# Patient Record
Sex: Female | Born: 1935 | Race: White | Hispanic: No | Smoking: Former smoker
Health system: Southern US, Community
[De-identification: ages and names within clinical notes are randomized; demographics above are authoritative.]

## PROBLEM LIST (undated history)

## (undated) DIAGNOSIS — K219 Gastro-esophageal reflux disease without esophagitis: Secondary | ICD-10-CM

## (undated) DIAGNOSIS — G4700 Insomnia, unspecified: Secondary | ICD-10-CM

## (undated) DIAGNOSIS — M542 Cervicalgia: Secondary | ICD-10-CM

## (undated) DIAGNOSIS — B370 Candidal stomatitis: Secondary | ICD-10-CM

## (undated) DIAGNOSIS — F32A Depression, unspecified: Secondary | ICD-10-CM

## (undated) DIAGNOSIS — J329 Chronic sinusitis, unspecified: Secondary | ICD-10-CM

## (undated) DIAGNOSIS — I10 Essential (primary) hypertension: Secondary | ICD-10-CM

## (undated) DIAGNOSIS — R1310 Dysphagia, unspecified: Secondary | ICD-10-CM

## (undated) DIAGNOSIS — R350 Frequency of micturition: Secondary | ICD-10-CM

## (undated) DIAGNOSIS — K859 Acute pancreatitis without necrosis or infection, unspecified: Secondary | ICD-10-CM

## (undated) DIAGNOSIS — K922 Gastrointestinal hemorrhage, unspecified: Secondary | ICD-10-CM

## (undated) DIAGNOSIS — R5383 Other fatigue: Secondary | ICD-10-CM

## (undated) DIAGNOSIS — K5900 Constipation, unspecified: Secondary | ICD-10-CM

## (undated) DIAGNOSIS — R251 Tremor, unspecified: Secondary | ICD-10-CM

## (undated) DIAGNOSIS — G629 Polyneuropathy, unspecified: Secondary | ICD-10-CM

## (undated) DIAGNOSIS — M25519 Pain in unspecified shoulder: Secondary | ICD-10-CM

## (undated) DIAGNOSIS — B372 Candidiasis of skin and nail: Secondary | ICD-10-CM

## (undated) DIAGNOSIS — E871 Hypo-osmolality and hyponatremia: Secondary | ICD-10-CM

## (undated) DIAGNOSIS — E559 Vitamin D deficiency, unspecified: Secondary | ICD-10-CM

## (undated) DIAGNOSIS — K274 Chronic or unspecified peptic ulcer, site unspecified, with hemorrhage: Secondary | ICD-10-CM

## (undated) DIAGNOSIS — R109 Unspecified abdominal pain: Secondary | ICD-10-CM

## (undated) DIAGNOSIS — R269 Unspecified abnormalities of gait and mobility: Secondary | ICD-10-CM

## (undated) DIAGNOSIS — R609 Edema, unspecified: Secondary | ICD-10-CM

## (undated) DIAGNOSIS — G2581 Restless legs syndrome: Secondary | ICD-10-CM

## (undated) DIAGNOSIS — K2970 Gastritis, unspecified, without bleeding: Secondary | ICD-10-CM

## (undated) DIAGNOSIS — R42 Dizziness and giddiness: Secondary | ICD-10-CM

## (undated) DIAGNOSIS — H547 Unspecified visual loss: Secondary | ICD-10-CM

## (undated) DIAGNOSIS — K5790 Diverticulosis of intestine, part unspecified, without perforation or abscess without bleeding: Secondary | ICD-10-CM

## (undated) DIAGNOSIS — B351 Tinea unguium: Secondary | ICD-10-CM

## (undated) DIAGNOSIS — J40 Bronchitis, not specified as acute or chronic: Secondary | ICD-10-CM

## (undated) DIAGNOSIS — K625 Hemorrhage of anus and rectum: Secondary | ICD-10-CM

## (undated) DIAGNOSIS — M1990 Unspecified osteoarthritis, unspecified site: Secondary | ICD-10-CM

## (undated) DIAGNOSIS — H04123 Dry eye syndrome of bilateral lacrimal glands: Secondary | ICD-10-CM

## (undated) DIAGNOSIS — J309 Allergic rhinitis, unspecified: Secondary | ICD-10-CM

## (undated) DIAGNOSIS — I739 Peripheral vascular disease, unspecified: Secondary | ICD-10-CM

## (undated) DIAGNOSIS — H269 Unspecified cataract: Secondary | ICD-10-CM

## (undated) DIAGNOSIS — R739 Hyperglycemia, unspecified: Secondary | ICD-10-CM

## (undated) DIAGNOSIS — E876 Hypokalemia: Secondary | ICD-10-CM

## (undated) DIAGNOSIS — K449 Diaphragmatic hernia without obstruction or gangrene: Secondary | ICD-10-CM

## (undated) DIAGNOSIS — N289 Disorder of kidney and ureter, unspecified: Secondary | ICD-10-CM

## (undated) DIAGNOSIS — R4182 Altered mental status, unspecified: Secondary | ICD-10-CM

## (undated) DIAGNOSIS — F329 Major depressive disorder, single episode, unspecified: Secondary | ICD-10-CM

## (undated) DIAGNOSIS — M21379 Foot drop, unspecified foot: Secondary | ICD-10-CM

## (undated) HISTORY — PX: CHOLECYSTECTOMY: SHX55

## (undated) HISTORY — PX: ABDOMINAL HYSTERECTOMY: SHX81

## (undated) HISTORY — PX: APPENDECTOMY: SHX54

---

## 2015-04-06 ENCOUNTER — Emergency Department (HOSPITAL_COMMUNITY): Payer: Medicare Other

## 2015-04-06 ENCOUNTER — Encounter (HOSPITAL_COMMUNITY): Payer: Self-pay | Admitting: Emergency Medicine

## 2015-04-06 ENCOUNTER — Emergency Department (HOSPITAL_COMMUNITY)
Admission: EM | Admit: 2015-04-06 | Discharge: 2015-04-06 | Disposition: A | Discharge status: Home / Self Care | Payer: Medicare Other | Attending: Emergency Medicine | Admitting: Emergency Medicine

## 2015-04-06 DIAGNOSIS — Z3202 Encounter for pregnancy test, result negative: Secondary | ICD-10-CM | POA: Insufficient documentation

## 2015-04-06 DIAGNOSIS — M1990 Unspecified osteoarthritis, unspecified site: Secondary | ICD-10-CM | POA: Insufficient documentation

## 2015-04-06 DIAGNOSIS — K219 Gastro-esophageal reflux disease without esophagitis: Secondary | ICD-10-CM | POA: Insufficient documentation

## 2015-04-06 DIAGNOSIS — Z8711 Personal history of peptic ulcer disease: Secondary | ICD-10-CM | POA: Diagnosis not present

## 2015-04-06 DIAGNOSIS — N183 Chronic kidney disease, stage 3 (moderate): Secondary | ICD-10-CM | POA: Insufficient documentation

## 2015-04-06 DIAGNOSIS — R531 Weakness: Secondary | ICD-10-CM | POA: Diagnosis not present

## 2015-04-06 DIAGNOSIS — F329 Major depressive disorder, single episode, unspecified: Secondary | ICD-10-CM | POA: Insufficient documentation

## 2015-04-06 DIAGNOSIS — Z872 Personal history of diseases of the skin and subcutaneous tissue: Secondary | ICD-10-CM | POA: Diagnosis not present

## 2015-04-06 DIAGNOSIS — I129 Hypertensive chronic kidney disease with stage 1 through stage 4 chronic kidney disease, or unspecified chronic kidney disease: Secondary | ICD-10-CM | POA: Diagnosis not present

## 2015-04-06 DIAGNOSIS — Z79899 Other long term (current) drug therapy: Secondary | ICD-10-CM | POA: Diagnosis not present

## 2015-04-06 DIAGNOSIS — R410 Disorientation, unspecified: Secondary | ICD-10-CM | POA: Insufficient documentation

## 2015-04-06 HISTORY — DX: Hypercalcemia: E83.52

## 2015-04-06 HISTORY — DX: Gastro-esophageal reflux disease without esophagitis: K21.9

## 2015-04-06 HISTORY — DX: Foot drop, unspecified foot: M21.379

## 2015-04-06 HISTORY — DX: Dry eye syndrome of bilateral lacrimal glands: H04.123

## 2015-04-06 HISTORY — DX: Candidal stomatitis: B37.0

## 2015-04-06 HISTORY — DX: Acute pancreatitis without necrosis or infection, unspecified: K85.90

## 2015-04-06 HISTORY — DX: Diverticulosis of intestine, part unspecified, without perforation or abscess without bleeding: K57.90

## 2015-04-06 HISTORY — DX: Unspecified abdominal pain: R10.9

## 2015-04-06 HISTORY — DX: Other fatigue: R53.83

## 2015-04-06 HISTORY — DX: Essential (primary) hypertension: I10

## 2015-04-06 HISTORY — DX: Major depressive disorder, single episode, unspecified: F32.9

## 2015-04-06 HISTORY — DX: Candidiasis of skin and nail: B37.2

## 2015-04-06 HISTORY — DX: Tinea unguium: B35.1

## 2015-04-06 HISTORY — DX: Unspecified cataract: H26.9

## 2015-04-06 HISTORY — DX: Insomnia, unspecified: G47.00

## 2015-04-06 HISTORY — DX: Constipation, unspecified: K59.00

## 2015-04-06 HISTORY — DX: Depression, unspecified: F32.A

## 2015-04-06 HISTORY — DX: Unspecified osteoarthritis, unspecified site: M19.90

## 2015-04-06 HISTORY — DX: Hypo-osmolality and hyponatremia: E87.1

## 2015-04-06 HISTORY — DX: Restless legs syndrome: G25.81

## 2015-04-06 HISTORY — DX: Tremor, unspecified: R25.1

## 2015-04-06 HISTORY — DX: Hypokalemia: E87.6

## 2015-04-06 HISTORY — DX: Cervicalgia: M54.2

## 2015-04-06 HISTORY — DX: Chronic sinusitis, unspecified: J32.9

## 2015-04-06 HISTORY — DX: Disorder of kidney and ureter, unspecified: N28.9

## 2015-04-06 HISTORY — DX: Polyneuropathy, unspecified: G62.9

## 2015-04-06 HISTORY — DX: Allergic rhinitis, unspecified: J30.9

## 2015-04-06 HISTORY — DX: Hyperglycemia, unspecified: R73.9

## 2015-04-06 HISTORY — DX: Hemorrhage of anus and rectum: K62.5

## 2015-04-06 HISTORY — DX: Dysphagia, unspecified: R13.10

## 2015-04-06 HISTORY — DX: Bronchitis, not specified as acute or chronic: J40

## 2015-04-06 HISTORY — DX: Pain in unspecified shoulder: M25.519

## 2015-04-06 HISTORY — DX: Unspecified abnormalities of gait and mobility: R26.9

## 2015-04-06 HISTORY — DX: Peripheral vascular disease, unspecified: I73.9

## 2015-04-06 HISTORY — DX: Dizziness and giddiness: R42

## 2015-04-06 HISTORY — DX: Gastrointestinal hemorrhage, unspecified: K92.2

## 2015-04-06 HISTORY — DX: Vitamin D deficiency, unspecified: E55.9

## 2015-04-06 HISTORY — DX: Frequency of micturition: R35.0

## 2015-04-06 HISTORY — DX: Gastritis, unspecified, without bleeding: K29.70

## 2015-04-06 HISTORY — DX: Diaphragmatic hernia without obstruction or gangrene: K44.9

## 2015-04-06 HISTORY — DX: Unspecified visual loss: H54.7

## 2015-04-06 HISTORY — DX: Altered mental status, unspecified: R41.82

## 2015-04-06 HISTORY — DX: Chronic or unspecified peptic ulcer, site unspecified, with hemorrhage: K27.4

## 2015-04-06 HISTORY — DX: Edema, unspecified: R60.9

## 2015-04-06 LAB — COMPREHENSIVE METABOLIC PANEL
ALT: 15 U/L (ref 14–54)
AST: 21 U/L (ref 15–41)
Albumin: 4.1 g/dL (ref 3.5–5.0)
Alkaline Phosphatase: 64 U/L (ref 38–126)
Anion gap: 8 (ref 5–15)
BILIRUBIN TOTAL: 1.3 mg/dL — AB (ref 0.3–1.2)
BUN: 17 mg/dL (ref 6–20)
CALCIUM: 9.9 mg/dL (ref 8.9–10.3)
CHLORIDE: 100 mmol/L — AB (ref 101–111)
CO2: 31 mmol/L (ref 22–32)
Creatinine, Ser: 0.94 mg/dL (ref 0.44–1.00)
GFR calc Af Amer: 57 mL/min — ABNORMAL LOW (ref >60)
GFR, EST NON AFRICAN AMERICAN: 50 mL/min — AB (ref >60)
GLUCOSE: 122 mg/dL — AB (ref 65–99)
Potassium: 4.3 mmol/L (ref 3.5–5.1)
SODIUM: 139 mmol/L (ref 135–145)
TOTAL PROTEIN: 7.3 g/dL (ref 6.5–8.1)

## 2015-04-06 LAB — CBC WITH DIFFERENTIAL/PLATELET
Basophils Absolute: 0 10*3/uL (ref 0.0–0.1)
Basophils Relative: 0 % (ref 0–1)
Eosinophils Absolute: 0.1 10*3/uL (ref 0.0–0.7)
Eosinophils Relative: 2 % (ref 0–5)
HCT: 44.6 % (ref 36.0–46.0)
Hemoglobin: 14.7 g/dL (ref 12.0–15.0)
LYMPHS ABS: 1.1 10*3/uL (ref 0.7–4.0)
Lymphocytes Relative: 15 % (ref 12–46)
MCH: 29.9 pg (ref 26.0–34.0)
MCHC: 33 g/dL (ref 30.0–36.0)
MCV: 90.8 fL (ref 78.0–100.0)
MONO ABS: 0.5 10*3/uL (ref 0.1–1.0)
Monocytes Relative: 7 % (ref 3–12)
Neutro Abs: 5.8 10*3/uL (ref 1.7–7.7)
Neutrophils Relative %: 76 % (ref 43–77)
Platelets: 139 10*3/uL — ABNORMAL LOW (ref 150–400)
RBC: 4.91 MIL/uL (ref 3.87–5.11)
RDW: 13.3 % (ref 11.5–15.5)
WBC: 7.6 10*3/uL (ref 4.0–10.5)

## 2015-04-06 LAB — I-STAT BETA HCG BLOOD, ED (MC, WL, AP ONLY): I-stat hCG, quantitative: 7.9 m[IU]/mL — ABNORMAL HIGH (ref <5)

## 2015-04-06 LAB — I-STAT CG4 LACTIC ACID, ED
LACTIC ACID, VENOUS: 1.19 mmol/L (ref 0.5–2.0)
LACTIC ACID, VENOUS: 0.86 mmol/L (ref 0.5–2.0)
LACTIC ACID, VENOUS: 0.96 mmol/L (ref 0.5–2.0)

## 2015-04-06 LAB — URINALYSIS, ROUTINE W REFLEX MICROSCOPIC
BILIRUBIN URINE: NEGATIVE
GLUCOSE, UA: NEGATIVE mg/dL
Hgb urine dipstick: NEGATIVE
Ketones, ur: NEGATIVE mg/dL
LEUKOCYTES UA: NEGATIVE
Nitrite: NEGATIVE
PROTEIN: 30 mg/dL — AB
SPECIFIC GRAVITY, URINE: 1.005 (ref 1.005–1.030)
UROBILINOGEN UA: 1 mg/dL (ref 0.0–1.0)
pH: 7 (ref 5.0–8.0)

## 2015-04-06 LAB — I-STAT TROPONIN, ED: Troponin i, poc: 0.02 ng/mL (ref 0.00–0.08)

## 2015-04-06 LAB — URINE MICROSCOPIC-ADD ON

## 2015-04-06 MED ORDER — HYDROCHLOROTHIAZIDE 12.5 MG PO CAPS
25.0000 mg | ORAL_CAPSULE | Freq: Once | ORAL | Status: AC
  Administered 2015-04-06: 25 mg via ORAL
  Filled 2015-04-06: qty 2

## 2015-04-06 NOTE — Progress Notes (Addendum)
Pt listed from [facility] facility with dementia Cm went to pt room to review forms from facility which indicates Alyssa [name] as pcp EPIC updated  Cm spoke with pt who is oriented to person, place and situation Pt able to tell Cm who she is where she lived prior to facility and that she knew she was "in the hospital" but could not tell CM name of hospital. Cm informed her she was at [facility] long Pt inquired of Cm how she could use the restroom.  Cm assisted her to the bedpan Provided nursing with pt urine sample Pt able to lift her bottom and to turn to both sides with minimal assistance or redirection.  Pt very pleasant and cooperative Cm instructed pt on use of call bell for nursing staff Prior to Cm leaving Xray staff came to take pt to radiology

## 2015-04-06 NOTE — ED Provider Notes (Signed)
CSN: [id number]     Arrival date & time 04/06/15  1129 History   First MD Initiated Contact with Patient 04/06/15 1156     Chief Complaint  Patient presents with  . Weakness     (Consider location/radiation/quality/duration/timing/severity/associated sxs/prior Treatment) Patient is a 81 y.o. female presenting with weakness. The history is provided by the nursing home, medical records and the EMS personnel. No language interpreter was used.  Weakness Associated symptoms include weakness.     Leylany Nored is a 81 y.o. female  with a hx of hypertension, CK D, urinary frequency, neuropathy, GERD, dementia presents to the Emergency Department [facility].  Patient's facility reports that she is shakier than usual and has increased generalized weakness. Per EMS the facility states patient is at mental baseline and she has dementia. Level V caveat.  Discussed with LPN at patient's facility. He reports she was somewhat confused yesterday but that increased significantly this morning. They reported that she had decreased responses to their questioning and that she was very shaky, unable to walk with her walker which is unusual for her.  Past Medical History  Diagnosis Date  . Hypertension   . Renal disorder     CKD stage 3  . Dizziness   . Foot drop   . Bronchitis   . Urinary frequency   . Candidal intertrigo   . Altered mental status   . Fatigue   . Chronic GI hemorrhage   . Edema   . Gait disturbance   . Neuropathy   . Vision impairment   . Dry eyes   . Thrush   . GERD (gastroesophageal reflux disease)   . Pancreatitis   . Abdominal pain   . Rectal bleed   . Shoulder pain   . Sinusitis   . Allergic rhinitis   . Hypokalemia   . Hyponatremia   . Hyperglycemia   . Tremor   . Hypercalcemia   . Neck pain   . Vitamin D deficiency   . Depression   . Osteoarthritis   . Abnormal gait   . Intermittent claudication   . Onychomycosis   . Constipation   .  Dysphagia   . Insomnia   . Cataract   . Restless leg syndrome   . Peptic ulcer disease with hemorrhage   . Diverticulosis   . Gastritis   . Hiatal hernia    Past Surgical History  Procedure Laterality Date  . Cholecystectomy    . Appendectomy    . Abdominal hysterectomy     No family history on file. History  Substance Use Topics  . Smoking status: Not on file  . Smokeless tobacco: Not on file  . Alcohol Use: No   OB History    No data available     Review of Systems  Unable to perform ROS: Dementia  Neurological: Positive for weakness.      Allergies  Iron and Voltaren  Home Medications   Prior to Admission medications   Medication Sig Start Date End Date Taking? Authorizing Provider  acetaminophen (TYLENOL) 500 MG tablet Take 1,000 mg by mouth at bedtime. May also take twice daily as needed for pain   Yes Historical Provider, MD  ergocalciferol (VITAMIN D2) 50000 UNITS capsule Take 50,000 Units by mouth every 30 (thirty) days.    Yes Historical Provider, MD  guaiFENesin (MUCINEX) 600 MG 12 hr tablet Take 600 mg by mouth 2 (two) times daily.   Yes Historical Provider, MD  guaiFENesin (ROBITUSSIN) 100 MG/5ML liquid Take 200 mg by mouth 2 (two) times daily as needed for cough.   Yes Historical Provider, MD  hydrochlorothiazide (HYDRODIURIL) 25 MG tablet Take 25 mg by mouth daily.   Yes Historical Provider, MD  ipratropium (ATROVENT) 0.03 % nasal spray Place 2 sprays into both nostrils 2 (two) times daily.   Yes Historical Provider, MD  IPRATROPIUM BROMIDE HFA IN Inhale 2 puffs into the lungs 2 (two) times daily.   Yes Historical Provider, MD  loratadine (CLARITIN) 10 MG tablet Take 10 mg by mouth daily.   Yes Historical Provider, MD  losartan (COZAAR) 100 MG tablet Take 100 mg by mouth daily.   Yes Historical Provider, MD  meclizine (ANTIVERT) 25 MG tablet Take 25 mg by mouth 3 (three) times daily as needed for dizziness.   Yes Historical Provider, MD  metoprolol  succinate (TOPROL-XL) 50 MG 24 hr tablet Take 50 mg by mouth daily.   Yes Historical Provider, MD  mirtazapine (REMERON) 15 MG tablet Take 15 mg by mouth at bedtime.   Yes Historical Provider, MD  omeprazole (PRILOSEC) 20 MG capsule Take 20 mg by mouth 2 (two) times daily.   Yes Historical Provider, MD  polyethylene glycol (MIRALAX / GLYCOLAX) packet Take 17 g by mouth daily. Mix with 4-8oz of liquid   Yes Historical Provider, MD  sennosides-docusate sodium (SENOKOT-S) 8.6-50 MG tablet Take 2 tablets by mouth 2 (two) times daily as needed for constipation.   Yes Historical Provider, MD  traZODone (DESYREL) 50 MG tablet Take 25 mg by mouth at bedtime.   Yes Historical Provider, MD   BP 216/91 mmHg  Pulse 66  Temp(Src) 98.5 F (36.9 C) (Oral)  Resp 16  SpO2 99% Physical Exam  Constitutional: She appears well-developed and well-nourished. No distress.  Awake, alert, nontoxic appearance  HENT:  Head: Normocephalic and atraumatic.  Mouth/Throat: Oropharynx is clear and moist. No oropharyngeal exudate.  Eyes: Conjunctivae are normal. No scleral icterus.  Neck: Normal range of motion. Neck supple.  Cardiovascular: Normal rate, regular rhythm, normal heart sounds and intact distal pulses.   No murmur heard. Pulmonary/Chest: Effort normal and breath sounds normal. No respiratory distress. She has no wheezes.  Equal chest expansion  Abdominal: Soft. Bowel sounds are normal. She exhibits no mass. There is no tenderness. There is no rebound and no guarding.  Soft and nontender  Musculoskeletal: Normal range of motion. She exhibits no edema.  Neurological: She is alert.  Speech is clear and goal oriented Moves extremities without ataxia 5/5 in BUE and BLE while lying in bed No tremor noted on seated exam DTRs 2+ in all 4 extremities  Skin: Skin is warm and dry. She is not diaphoretic. No erythema.  Psychiatric: She has a normal mood and affect.  Nursing note and vitals reviewed.   ED Course   Procedures (including critical care time) Labs Review Labs Reviewed  CBC WITH DIFFERENTIAL/PLATELET - Abnormal; Notable for the following:    Platelets 139 (*)    All other components within normal limits  COMPREHENSIVE METABOLIC PANEL - Abnormal; Notable for the following:    Chloride 100 (*)    Glucose, Bld 122 (*)    Total Bilirubin 1.3 (*)    GFR calc non Af Amer 50 (*)    GFR calc Af Amer 57 (*)    All other components within normal limits  URINALYSIS, ROUTINE W REFLEX MICROSCOPIC (NOT AT [facility]) - Abnormal; Notable for the following:  APPearance CLOUDY (*)    Protein, ur 30 (*)    All other components within normal limits  URINE MICROSCOPIC-ADD ON - Abnormal; Notable for the following:    Bacteria, UA MANY (*)    All other components within normal limits  I-STAT BETA HCG BLOOD, ED ([facility], [facility], [facility] ONLY) - Abnormal; Notable for the following:    I-stat hCG, quantitative 7.9 (*)    All other components within normal limits  I-STAT CG4 LACTIC ACID, ED  I-STAT TROPOININ, ED  I-STAT CG4 LACTIC ACID, ED  I-STAT CG4 LACTIC ACID, ED    Imaging Review Dg Chest 2 View  04/06/2015   CLINICAL DATA:  81 year old with cough. Weakness. History of bronchitis and chronic kidney disease. Initial encounter.  EXAM: CHEST  2 VIEW  COMPARISON:  None.  FINDINGS: The heart size and mediastinal contours are normal. There are calcified mediastinal and left hilar lymph nodes. Small calcified granulomas are present within the left lung. There is no confluent airspace opacity, edema or significant pleural effusion. Nodular density at the left apex is attributed to asymmetry of the left first costochondral junction. There is a probable loose body in the left superior subscapularis recess.  IMPRESSION: 1. No evidence of pneumonia or other acute chest process. 2. Evidence of prior granulomatous disease. 3. Probable asymmetry of the first costochondral junctions accounting for asymmetric density at the left lung  apex. This could be confirmed with an apical lordotic view.   Electronically Signed   By: Carey Bullocks M.D.   On: 04/06/2015 13:23   Ct Head Wo Contrast  04/06/2015   CLINICAL DATA:  Weakness and confusion  EXAM: CT HEAD WITHOUT CONTRAST  TECHNIQUE: Contiguous axial images were obtained from the base of the skull through the vertex without intravenous contrast.  COMPARISON:  None.  FINDINGS: Generalized atrophy. Moderate chronic microvascular ischemic change throughout the white matter.  Negative for acute infarct.  Negative for hemorrhage or mass lesion.  Calvarium is intact.  IMPRESSION: Chronic microvascular ischemic changes and atrophy. No acute abnormality.   Electronically Signed   By: Marlan Palau M.D.   On: 04/06/2015 14:45     EKG Interpretation   Date/Time:  Wednesday April 06 2015 11:42:02 EDT Ventricular Rate:  64 PR Interval:  171 QRS Duration: 94 QT Interval:  442 QTC Calculation: 456 R Axis:   -53 Text Interpretation:  Sinus rhythm LAD, consider left anterior fascicular  block Probable anteroseptal infarct, old Baseline wander in lead(s) III V3  V4 V5 V6 Baseline wander difficult to read will repeat Confirmed by  Kandis Mannan ([id number]) on 04/06/2015 12:15:28 PM        MDM   Final diagnoses:  Weakness   Renae Mottley presents from ALF with increased confusion and generalized weakness. Will begin workup looking for infection.  If negative will obtain CT head.    Patient noted to be hypertensive in the emergency department.  No signs of hypertensive urgency on hx or exam.  Labs pending.    4:25 PM Infection found. CT head normal. Patient is unable to walk as her legs become very weak and shaky. Patient's daughter is at bedside he reports that this is abnormal. She reports that her mother went out of town with her this weekend and was able to use a walker to walk from the car to the house and up 6+ stairs without difficulty. She reports she has never seen her shaky  like this before.  Pt remains hypertensive without  evidence of hypertensive urgency.  Her home HCTZ was ordered.  Question if her morning dose was given.    Pt to be evaluated by [place] of social work to discuss increased care at her ALF vs placement into a SNF.  Pt is medically cleared to be d/c home, but will need extra assistance due to her weakness.  Family at bedside to assist with this decision.    The patient was discussed with and seen by Dr. Corlis Leak who agrees with the treatment plan.  5:11 PM Care transferred to Will Dansie, PA-C who will follow with social work for disposition location.        Dahlia Client Jeanny Rymer, PA-C 04/06/15 1712  Courteney Randall An, MD 04/12/15 [phone]

## 2015-04-06 NOTE — ED Notes (Signed)
Per EMS, patient from [place]. Sent in for generalized weakness. Per EMS patient is at mental baseline dementia. Patient is alert.

## 2015-04-06 NOTE — Progress Notes (Signed)
CSW was consulted to speak with patient due to daughter wanting a higher level of care.   CSW met with patient at bedside. Daughter was present. Daughter confirms that the patient comes from [facility]. She states that the patient has been living at the facility for 4 months.  Patient informed CSW that she does not fall often. Daughter states that the patient has not fallen since March. Daughter states that the patient has a good support system and that she is her primary support. Daughter informed CSW that she lives in [place] and visits the patient x3 a week.  CSW reached out to [facility] and spoke with Nina/Director of Admissions and Priscilla/Director of Nursing who both state that their facility will be able to upgrade the patient's level of care. She states that they will be able to discuss care arrangements with daughter and patient once she returns to facility.   Patient and daughter state that they do not have any questions at this time.  CSW made daughter aware. CSW made PA aware of discussion with facility directors.  Daughter/ Nani Ravens [phone]  Willette Brace [phone] ED CSW 04/06/2015 5:45 PM

## 2015-04-06 NOTE — ED Notes (Signed)
Pt walked from bed to door with walker, was very weak and shaky.

## 2015-04-06 NOTE — ED Provider Notes (Signed)
Medical screening examination/treatment/procedure(s) were conducted as a shared visit with non-physician practitioner(s) and myself.  I personally evaluated the patient during the encounter.  I saw patient with APP and agree with the assessment.    A shows a normal 81 year old female presenting with weakness. Patient has normal physical exam. Normal labs. Normal vital signs. She is unable however to walk. It is not due to focal weakness but rather overall global weakness. She however does not meet admission criteria.   Will discuss with social work.    Kristi Norment Randall An, MD 04/06/15 [phone]

## 2015-04-06 NOTE — Discharge Instructions (Signed)
Please follow-up with your primary care doctor to have your blood pressure recheck.  Weakness Weakness is a lack of strength. It may be felt all over the body (generalized) or in one specific part of the body (focal). Some causes of weakness can be serious. You may need further medical evaluation, especially if you are elderly or you have a history of immunosuppression (such as chemotherapy or HIV), kidney disease, heart disease, or diabetes. CAUSES  Weakness can be caused by many different things, including:  Infection.  Physical exhaustion.  Internal bleeding or other blood loss that results in a lack of red blood cells (anemia).  Dehydration. This cause is more common in elderly people.  Side effects or electrolyte abnormalities from medicines, such as pain medicines or sedatives.  Emotional distress, anxiety, or depression.  Circulation problems, especially severe peripheral arterial disease.  Heart disease, such as rapid atrial fibrillation, bradycardia, or heart failure.  Nervous system disorders, such as Guillain-Barr syndrome, multiple sclerosis, or stroke. DIAGNOSIS  To find the cause of your weakness, your caregiver will take your history and perform a physical exam. Lab tests or X-rays may also be ordered, if needed. TREATMENT  Treatment of weakness depends on the cause of your symptoms and can vary greatly. HOME CARE INSTRUCTIONS   Rest as needed.  Eat a well-balanced diet.  Try to get some exercise every day.  Only take over-the-counter or prescription medicines as directed by your caregiver. SEEK MEDICAL CARE IF:   Your weakness seems to be getting worse or spreads to other parts of your body.  You develop new aches or pains. SEEK IMMEDIATE MEDICAL CARE IF:   You cannot perform your normal daily activities, such as getting dressed and feeding yourself.  You cannot walk up and down stairs, or you feel exhausted when you do so.  You have shortness of  breath or chest pain.  You have difficulty moving parts of your body.  You have weakness in only one area of the body or on only one side of the body.  You have a fever.  You have trouble speaking or swallowing.  You cannot control your bladder or bowel movements.  You have black or bloody vomit or stools. MAKE SURE YOU:  Understand these instructions.  Will watch your condition.  Will get help right away if you are not doing well or get worse. Document Released: 08/20/2005 Document Revised: 02/19/2012 Document Reviewed: 10/19/2011 [facility] Patient Information 2015 [place], [place]. This information is not intended to replace advice given to you by your health care provider. Make sure you discuss any questions you have with your health care provider.

## 2015-04-06 NOTE — ED Notes (Signed)
Bed: ZO10 Expected date:  Expected time:  Means of arrival:  Comments: "abnormal level of shakiness"

## 2015-04-06 NOTE — ED Notes (Signed)
   RN OBTAINING LABS 

## 2015-04-06 NOTE — ED Provider Notes (Addendum)
This patient's care was handed off from [facility], PA-C at shift change. Patient is awaiting disposition after social work consult. Please see Hannah's note for further. Patient comes in complaining of generalized weakness. Workup by Dahlia Client found no infection and a normal head CT. No criteria for admission. Patient awaiting social work consult as patient requires further assistance from her assisted living facility. [place] from Child psychotherapist reports that she is contacted the patient's assisted living facility, [facility] who reports they will provide her a higher level of care at their facility. Plan is for patient discharge and taken home by [facility]. I discussed the findings with the patient and her daughter. They're in agreement with discharge and plan for further care at [facility]. Advised to return to the emergency department with new or worsening symptoms or new concerns. The patient and her daughter verbalized understanding and agreement with plan.  Results for orders placed or performed during the hospital encounter of 04/06/15  CBC with Differential  Result Value Ref Range   WBC 7.6 4.0 - 10.5 K/uL   RBC 4.91 3.87 - 5.11 MIL/uL   Hemoglobin 14.7 12.0 - 15.0 g/dL   HCT 16.1 09.6 - 04.5 %   MCV 90.8 78.0 - 100.0 fL   MCH 29.9 26.0 - 34.0 pg   MCHC 33.0 30.0 - 36.0 g/dL   RDW 40.9 81.1 - 91.4 %   Platelets 139 (L) 150 - 400 K/uL   Neutrophils Relative % 76 43 - 77 %   Neutro Abs 5.8 1.7 - 7.7 K/uL   Lymphocytes Relative 15 12 - 46 %   Lymphs Abs 1.1 0.7 - 4.0 K/uL   Monocytes Relative 7 3 - 12 %   Monocytes Absolute 0.5 0.1 - 1.0 K/uL   Eosinophils Relative 2 0 - 5 %   Eosinophils Absolute 0.1 0.0 - 0.7 K/uL   Basophils Relative 0 0 - 1 %   Basophils Absolute 0.0 0.0 - 0.1 K/uL  Comprehensive metabolic panel  Result Value Ref Range   Sodium 139 135 - 145 mmol/L   Potassium 4.3 3.5 - 5.1 mmol/L   Chloride 100 (L) 101 - 111 mmol/L   CO2 31 22 - 32 mmol/L   Glucose, Bld 122 (H) 65 - 99 mg/dL   BUN 17 6 - 20 mg/dL   Creatinine, Ser 7.82 0.44 - 1.00 mg/dL   Calcium 9.9 8.9 - 95.6 mg/dL   Total Protein 7.3 6.5 - 8.1 g/dL   Albumin 4.1 3.5 - 5.0 g/dL   AST 21 15 - 41 U/L   ALT 15 14 - 54 U/L   Alkaline Phosphatase 64 38 - 126 U/L   Total Bilirubin 1.3 (H) 0.3 - 1.2 mg/dL   GFR calc non Af Amer 50 (L) >60 mL/min   GFR calc Af Amer 57 (L) >60 mL/min   Anion gap 8 5 - 15  Urinalysis, Routine w reflex microscopic (not at [facility])  Result Value Ref Range   Color, Urine YELLOW YELLOW   APPearance CLOUDY (A) CLEAR   Specific Gravity, Urine 1.005 1.005 - 1.030   pH 7.0 5.0 - 8.0   Glucose, UA NEGATIVE NEGATIVE mg/dL   Hgb urine dipstick NEGATIVE NEGATIVE   Bilirubin Urine NEGATIVE NEGATIVE   Ketones, ur NEGATIVE NEGATIVE mg/dL   Protein, ur 30 (A) NEGATIVE mg/dL   Urobilinogen, UA 1.0 0.0 - 1.0 mg/dL   Nitrite NEGATIVE NEGATIVE   Leukocytes, UA NEGATIVE NEGATIVE  Urine microscopic-add on  Result  Value Ref Range   Squamous Epithelial / LPF RARE RARE   WBC, UA 0-2 <3 WBC/hpf   Bacteria, UA MANY (A) RARE  I-Stat CG4 Lactic Acid, ED  Result Value Ref Range   Lactic Acid, Venous 1.19 0.5 - 2.0 mmol/L  I-stat troponin, ED  Result Value Ref Range   Troponin i, poc 0.02 0.00 - 0.08 ng/mL   Comment 3          I-Stat CG4 Lactic Acid, ED  Result Value Ref Range   Lactic Acid, Venous 0.86 0.5 - 2.0 mmol/L  I-Stat CG4 Lactic Acid, ED  Result Value Ref Range   Lactic Acid, Venous 0.96 0.5 - 2.0 mmol/L  I-Stat beta hCG blood, ED ([facility], [facility], [facility] only)  Result Value Ref Range   I-stat hCG, quantitative 7.9 (H) <5 mIU/mL   Comment 3           Dg Chest 2 View  04/06/2015   CLINICAL DATA:  81 year old with cough. Weakness. History of bronchitis and chronic kidney disease. Initial encounter.  EXAM: CHEST  2 VIEW  COMPARISON:  None.  FINDINGS: The heart size and mediastinal contours are normal. There are calcified mediastinal and left hilar lymph nodes. Small  calcified granulomas are present within the left lung. There is no confluent airspace opacity, edema or significant pleural effusion. Nodular density at the left apex is attributed to asymmetry of the left first costochondral junction. There is a probable loose body in the left superior subscapularis recess.  IMPRESSION: 1. No evidence of pneumonia or other acute chest process. 2. Evidence of prior granulomatous disease. 3. Probable asymmetry of the first costochondral junctions accounting for asymmetric density at the left lung apex. This could be confirmed with an apical lordotic view.   Electronically Signed   By: Carey Bullocks M.D.   On: 04/06/2015 13:23   Ct Head Wo Contrast  04/06/2015   CLINICAL DATA:  Weakness and confusion  EXAM: CT HEAD WITHOUT CONTRAST  TECHNIQUE: Contiguous axial images were obtained from the base of the skull through the vertex without intravenous contrast.  COMPARISON:  None.  FINDINGS: Generalized atrophy. Moderate chronic microvascular ischemic change throughout the white matter.  Negative for acute infarct.  Negative for hemorrhage or mass lesion.  Calvarium is intact.  IMPRESSION: Chronic microvascular ischemic changes and atrophy. No acute abnormality.   Electronically Signed   By: Marlan Palau M.D.   On: 04/06/2015 14:45      Everlene Farrier, PA-C 04/06/15 1841  Shahad Mazurek Randall An, MD 04/07/15 [phone]  Medical screening examination/treatment/procedure(s) were conducted as a shared visit with non-physician practitioner(s) and myself.  I personally evaluated the patient during the encounter.   EKG Interpretation   Date/Time:  Wednesday April 06 2015 12:33:10 EDT Ventricular Rate:  60 PR Interval:  167 QRS Duration: 91 QT Interval:  440 QTC Calculation: 440 R Axis:   -56 Text Interpretation:  Sinus rhythm Left anterior fascicular block  Anteroseptal infarct, age indeterminate ED PHYSICIAN INTERPRETATION  AVAILABLE IN [facility] Confirmed by TEST,  Record ([id number]) on 04/07/2015  6:35:38 AM       Theodor Mustin Lyn Amando Ishikawa, MD 04/11/15 [id number]

## 2015-04-08 LAB — URINE CULTURE: Culture: >=100000

## 2015-04-09 NOTE — Progress Notes (Signed)
ED Antimicrobial Stewardship Positive Culture Follow Up   Alyssa [name] is an 81 y.o. female who presented to [facility] on 04/06/2015 with a chief complaint of  Chief Complaint  Patient presents with  . Weakness    Recent Results (from the past 720 hour(s))  Urine culture     Status: None   Collection Time: 04/06/15 12:56 PM  Result Value Ref Range Status   Specimen Description URINE, CATHETERIZED  Final   Special Requests NONE  Final   Culture   Final    >=100,000 COLONIES/mL KLEBSIELLA PNEUMONIAE Performed at [facility]    Report Status 04/08/2015 FINAL  Final   Organism ID, Bacteria KLEBSIELLA PNEUMONIAE  Final      Susceptibility   Klebsiella pneumoniae - MIC*    AMPICILLIN 16 RESISTANT Resistant     CEFAZOLIN <=4 SENSITIVE Sensitive     CEFTRIAXONE <=1 SENSITIVE Sensitive     CIPROFLOXACIN <=0.25 SENSITIVE Sensitive     GENTAMICIN <=1 SENSITIVE Sensitive     IMIPENEM <=0.25 SENSITIVE Sensitive     NITROFURANTOIN <=16 SENSITIVE Sensitive     TRIMETH/SULFA <=20 SENSITIVE Sensitive     AMPICILLIN/SULBACTAM 4 SENSITIVE Sensitive     PIP/TAZO <=4 SENSITIVE Sensitive     * >=100,000 COLONIES/mL KLEBSIELLA PNEUMONIAE     Patient discharged originally without antimicrobial agent and treatment is now indicated  New antibiotic prescription: Keflex  PO BID x 7 days  ED Provider: Ivar Drape, PA-C   Sallee Provencal 04/09/2015, 11:22 AM Infectious Diseases Pharmacist Phone# [phone]

## 2015-04-10 ENCOUNTER — Telehealth (HOSPITAL_COMMUNITY): Payer: Self-pay | Admitting: Emergency Medicine

## 2015-04-10 NOTE — Telephone Encounter (Signed)
Post ED Visit - Positive Culture Follow-up: Successful Patient Follow-Up  Culture assessed and recommendations reviewed by:  Celedonio Miyamoto, Pharm.D., BCPS-AQ ID  Georgina Pillion, Pharm.D., BCPS  [place], [address].D., BCPS, AAHIVP  Estella Husk, Pharm.D., BCPS, AAHIVP  Tegan Magsam, Pharm.D.  Tennis Must, Pharm.D.  Positive Urine culture   Patient discharged without antimicrobial prescription and treatment is now indicated  Organism is resistant to prescribed ED discharge antimicrobial  Patient with positive blood cultures  Changes discussed with ED provider: Ivar Drape PA New antibiotic prescription: Keflex 500 mg PA BID x seven days  Patient resident at [facility] [phone]. Notified [facility] LPN of result. Faxed result/order to [facility] [phone]   Jiles Harold 04/10/2015, 10:40 AM

## 2015-11-11 ENCOUNTER — Encounter (HOSPITAL_COMMUNITY): Payer: Self-pay | Admitting: Emergency Medicine

## 2015-11-11 ENCOUNTER — Emergency Department (HOSPITAL_COMMUNITY): Payer: Medicare Other

## 2015-11-11 ENCOUNTER — Inpatient Hospital Stay (HOSPITAL_COMMUNITY)
Admission: EM | Admit: 2015-11-11 | Discharge: 2015-11-14 | DRG: 188 | Disposition: A | Discharge status: Nursing Home (Includes ICF) | Payer: Medicare Other | Attending: Internal Medicine | Admitting: Internal Medicine

## 2015-11-11 DIAGNOSIS — Z888 Allergy status to other drugs, medicaments and biological substances status: Secondary | ICD-10-CM

## 2015-11-11 DIAGNOSIS — E559 Vitamin D deficiency, unspecified: Secondary | ICD-10-CM | POA: Diagnosis present

## 2015-11-11 DIAGNOSIS — M1990 Unspecified osteoarthritis, unspecified site: Secondary | ICD-10-CM | POA: Diagnosis present

## 2015-11-11 DIAGNOSIS — Z8711 Personal history of peptic ulcer disease: Secondary | ICD-10-CM

## 2015-11-11 DIAGNOSIS — G2581 Restless legs syndrome: Secondary | ICD-10-CM | POA: Diagnosis present

## 2015-11-11 DIAGNOSIS — N183 Chronic kidney disease, stage 3 (moderate): Secondary | ICD-10-CM | POA: Diagnosis present

## 2015-11-11 DIAGNOSIS — K219 Gastro-esophageal reflux disease without esophagitis: Secondary | ICD-10-CM | POA: Diagnosis present

## 2015-11-11 DIAGNOSIS — Z66 Do not resuscitate: Secondary | ICD-10-CM | POA: Diagnosis present

## 2015-11-11 DIAGNOSIS — J90 Pleural effusion, not elsewhere classified: Principal | ICD-10-CM | POA: Diagnosis present

## 2015-11-11 DIAGNOSIS — R0600 Dyspnea, unspecified: Secondary | ICD-10-CM | POA: Insufficient documentation

## 2015-11-11 DIAGNOSIS — F329 Major depressive disorder, single episode, unspecified: Secondary | ICD-10-CM | POA: Diagnosis present

## 2015-11-11 DIAGNOSIS — I160 Hypertensive urgency: Secondary | ICD-10-CM | POA: Diagnosis present

## 2015-11-11 DIAGNOSIS — I129 Hypertensive chronic kidney disease with stage 1 through stage 4 chronic kidney disease, or unspecified chronic kidney disease: Secondary | ICD-10-CM | POA: Diagnosis present

## 2015-11-11 DIAGNOSIS — Z79899 Other long term (current) drug therapy: Secondary | ICD-10-CM

## 2015-11-11 DIAGNOSIS — F32A Depression, unspecified: Secondary | ICD-10-CM | POA: Insufficient documentation

## 2015-11-11 DIAGNOSIS — H547 Unspecified visual loss: Secondary | ICD-10-CM | POA: Diagnosis present

## 2015-11-11 DIAGNOSIS — I5020 Unspecified systolic (congestive) heart failure: Secondary | ICD-10-CM

## 2015-11-11 NOTE — ED Notes (Signed)
Pt comes to Ed, via EMS pt got chest xrays back from her PCP, and shows possible CHF and pneumonia. SOB for past two weeks, pt family was to find out what is going on with her and know options for possible tx.   Vs on arrival 160/90, pulse 68, rr 20, 97 percent on 2 liter. Pt comes from [facility] [address]. Allergies iron dextran and voltarin.  Family on the otw.  DNR and MOST paper work on hand.

## 2015-11-11 NOTE — ED Notes (Signed)
Bed: [id number] Expected date:  Expected time:  Means of arrival:  Comments: EMS 89F SOB

## 2015-11-11 NOTE — ED Provider Notes (Signed)
CSN: [id number]     Arrival date & time 11/11/15  2328 History  By signing my name below, I, [name], attest that this documentation has been prepared under the direction and in the presence of Dechelle Attaway, MD. Electronically Signed: [name], Scribe. 11/11/2015. 11:51 PM.   Chief Complaint  Patient presents with  . Shortness of Breath   Patient is a 82 y.o. female presenting with shortness of breath. The history is provided by the patient and a relative. No language interpreter was used.  Shortness of Breath Severity:  Mild Onset quality:  Sudden Duration:  2 weeks Timing:  Constant Progression:  Worsening Chronicity:  New Context: not activity   Relieved by:  Nothing Worsened by:  Nothing tried Ineffective treatments:  None tried Associated symptoms: no abdominal pain, no chest pain, no fever and no wheezing   Risk factors: no recent alcohol use    HPI Comments: Alyssa [name] is a 82 y.o. female who presents to the Emergency Department complaining of sudden onset, constant, mild, SOB which began two weeks ago but worsened today. Pt received a chest x ray two weeks ago, which was found to be positive and indicative of either PNA or CHF according to the physician she saw. Pt does not wear oxygen at the facility she lives in. She denies hx of CHF.   Past Medical History  Diagnosis Date  . Hypertension   . Renal disorder     CKD stage 3  . Dizziness   . Foot drop   . Bronchitis   . Urinary frequency   . Candidal intertrigo   . Altered mental status   . Fatigue   . Chronic GI hemorrhage   . Edema   . Gait disturbance   . Neuropathy (HCC)   . Vision impairment   . Dry eyes   . Thrush   . GERD (gastroesophageal reflux disease)   . Pancreatitis   . Abdominal pain   . Rectal bleed   . Shoulder pain   . Sinusitis   . Allergic rhinitis   . Hypokalemia   . Hyponatremia   . Hyperglycemia   . Tremor   . Hypercalcemia   . Neck pain   . Vitamin D  deficiency   . Depression   . Osteoarthritis   . Abnormal gait   . Intermittent claudication (HCC)   . Onychomycosis   . Constipation   . Dysphagia   . Insomnia   . Cataract   . Restless leg syndrome   . Peptic ulcer disease with hemorrhage   . Diverticulosis   . Gastritis   . Hiatal hernia    Past Surgical History  Procedure Laterality Date  . Cholecystectomy    . Appendectomy    . Abdominal hysterectomy     No family history on file. Social History  Substance Use Topics  . Smoking status: Unknown If Ever Smoked  . Smokeless tobacco: None  . Alcohol Use: No   OB History    No data available     Review of Systems  Constitutional: Negative for fever.  Respiratory: Positive for shortness of breath. Negative for wheezing.   Cardiovascular: Negative for chest pain, palpitations and leg swelling.  Gastrointestinal: Negative for abdominal pain.  All other systems reviewed and are negative.  Allergies  Iron and Voltaren  Home Medications   Prior to Admission medications   Medication Sig Start Date End Date Taking? Authorizing Provider  acetaminophen (TYLENOL) 500 MG  tablet Take 1,000 mg by mouth at bedtime. May also take twice daily as needed for pain    Historical Provider, MD  ergocalciferol (VITAMIN D2) 50000 UNITS capsule Take 50,000 Units by mouth every 30 (thirty) days.     Historical Provider, MD  guaiFENesin (MUCINEX) 600 MG 12 hr tablet Take 600 mg by mouth 2 (two) times daily.    Historical Provider, MD  guaiFENesin (ROBITUSSIN) 100 MG/5ML liquid Take 200 mg by mouth 2 (two) times daily as needed for cough.    Historical Provider, MD  hydrochlorothiazide (HYDRODIURIL) 25 MG tablet Take 25 mg by mouth daily.    Historical Provider, MD  ipratropium (ATROVENT) 0.03 % nasal spray Place 2 sprays into both nostrils 2 (two) times daily.    Historical Provider, MD  IPRATROPIUM BROMIDE HFA IN Inhale 2 puffs into the lungs 2 (two) times daily.    Historical Provider, MD   loratadine (CLARITIN) 10 MG tablet Take 10 mg by mouth daily.    Historical Provider, MD  losartan (COZAAR) 100 MG tablet Take 100 mg by mouth daily.    Historical Provider, MD  meclizine (ANTIVERT) 25 MG tablet Take 25 mg by mouth 3 (three) times daily as needed for dizziness.    Historical Provider, MD  metoprolol succinate (TOPROL-XL) 50 MG 24 hr tablet Take 50 mg by mouth daily.    Historical Provider, MD  mirtazapine (REMERON) 15 MG tablet Take 15 mg by mouth at bedtime.    Historical Provider, MD  omeprazole (PRILOSEC) 20 MG capsule Take 20 mg by mouth 2 (two) times daily.    Historical Provider, MD  polyethylene glycol (MIRALAX / GLYCOLAX) packet Take 17 g by mouth daily. Mix with 4-8oz of liquid    Historical Provider, MD  sennosides-docusate sodium (SENOKOT-S) 8.6-50 MG tablet Take 2 tablets by mouth 2 (two) times daily as needed for constipation.    Historical Provider, MD  traZODone (DESYREL) 50 MG tablet Take 25 mg by mouth at bedtime.    Historical Provider, MD   There were no vitals taken for this visit. Physical Exam  Constitutional: She is oriented to person, place, and time. She appears well-developed and well-nourished. No distress.  HENT:  Head: Normocephalic and atraumatic.  Mouth/Throat: Oropharynx is clear and moist.  Moist mucous membranes  Eyes: Conjunctivae are normal. Pupils are equal, round, and reactive to light.  Neck: Normal range of motion. Neck supple.  No stridor, no bruits  Cardiovascular: Normal rate, regular rhythm and intact distal pulses.   Pulmonary/Chest: Effort normal. No stridor. No respiratory distress. She has no wheezes. She has no rales.  Slightly diminished at the bases but otherwise unremarkable   Abdominal: Soft. Bowel sounds are normal. She exhibits no distension. There is no tenderness. There is no rebound and no guarding.  Mild constipation  Musculoskeletal: Normal range of motion. She exhibits edema.  Edema of both ankles     Neurological: She is alert and oriented to person, place, and time.  Skin: Skin is warm and dry.  Psychiatric: She has a normal mood and affect.  Nursing note and vitals reviewed.   ED Course  Procedures  DIAGNOSTIC STUDIES:    Oxygen Saturation is 100% on 2L/min, normal by my interpretation.   COORDINATION OF CARE:  11:51 PM Will order chest xray, EKG, and labs. Discussed treatment plan with pt at bedside and pt agreed to plan.   Labs Review Labs Reviewed  I-STAT CHEM 8, ED - Abnormal; Notable for the  following:    Chloride 99 (*)    BUN 22 (*)    Glucose, Bld 122 (*)    All other components within normal limits  CBC WITH DIFFERENTIAL/PLATELET  Rosezena Sensor, ED   Imaging Review Dg Chest 2 View  11/12/2015  CLINICAL DATA:  82 year old female with shortness of breath for 2 weeks, worse today. EXAM: CHEST  2 VIEW COMPARISON:  04/06/2015 FINDINGS: Bilateral pleural effusions, moderate on left and small on the right. There are associated bibasilar opacities. Heart appears prominent size. Mild vascular congestion without overt edema. Calcified granuloma in the left lung with calcified left hilar nodes again seen. No pneumothorax. IMPRESSION: Bilateral pleural effusions, left greater than right. Mild vascular congestion. Correlation for fluid overload recommended. Electronically Signed   By: Rubye Oaks M.D.   On: 11/12/2015 01:07   I have personally reviewed and evaluated these images and lab results as part of my medical decision-making.   EKG Interpretation   Date/Time:  Friday November 11 2015 23:57:35 EST Ventricular Rate:  58 PR Interval:  193 QRS Duration: 93 QT Interval:  444 QTC Calculation: 436 R Axis:   18 Text Interpretation:  Sinus rhythm PVC PAC Confirmed by [facility]  MD,  Javell Blackburn ([id number]) on 11/11/2015 11:59:57 PM     MDM   Final diagnoses:  None    Results for orders placed or performed during the hospital encounter of 11/11/15  CBC with  Differential/Platelet  Result Value Ref Range   WBC 8.3 4.0 - 10.5 K/uL   RBC 4.27 3.87 - 5.11 MIL/uL   Hemoglobin 12.8 12.0 - 15.0 g/dL   HCT 19.1 47.8 - 29.5 %   MCV 93.4 78.0 - 100.0 fL   MCH 30.0 26.0 - 34.0 pg   MCHC 32.1 30.0 - 36.0 g/dL   RDW 62.1 30.8 - 65.7 %   Platelets 167 150 - 400 K/uL   Neutrophils Relative % 75 %   Neutro Abs 6.2 1.7 - 7.7 K/uL   Lymphocytes Relative 13 %   Lymphs Abs 1.1 0.7 - 4.0 K/uL   Monocytes Relative 10 %   Monocytes Absolute 0.8 0.1 - 1.0 K/uL   Eosinophils Relative 2 %   Eosinophils Absolute 0.2 0.0 - 0.7 K/uL   Basophils Relative 0 %   Basophils Absolute 0.0 0.0 - 0.1 K/uL  I-Stat Chem 8, ED  Result Value Ref Range   Sodium 141 135 - 145 mmol/L   Potassium 3.8 3.5 - 5.1 mmol/L   Chloride 99 (L) 101 - 111 mmol/L   BUN 22 (H) 6 - 20 mg/dL   Creatinine, Ser 8.46 0.44 - 1.00 mg/dL   Glucose, Bld 962 (H) 65 - 99 mg/dL   Calcium, Ion 9.52 8.41 - 1.30 mmol/L   TCO2 31 0 - 100 mmol/L   Hemoglobin 13.9 12.0 - 15.0 g/dL   HCT 32.4 40.1 - 02.7 %  I-stat troponin, ED  Result Value Ref Range   Troponin i, poc 0.01 0.00 - 0.08 ng/mL   Comment 3           Dg Chest 2 View  11/12/2015  CLINICAL DATA:  82 year old female with shortness of breath for 2 weeks, worse today. EXAM: CHEST  2 VIEW COMPARISON:  04/06/2015 FINDINGS: Bilateral pleural effusions, moderate on left and small on the right. There are associated bibasilar opacities. Heart appears prominent size. Mild vascular congestion without overt edema. Calcified granuloma in the left lung with calcified left hilar nodes again seen. No  pneumothorax. IMPRESSION: Bilateral pleural effusions, left greater than right. Mild vascular congestion. Correlation for fluid overload recommended. Electronically Signed   By: Rubye Oaks M.D.   On: 11/12/2015 01:07    Medications  furosemide (LASIX) injection 40 mg (not administered)  hydrALAZINE (APRESOLINE) injection 5 mg (not administered)   hydrALAZINE (APRESOLINE) tablet 25 mg (not administered)  hydrochlorothiazide (HYDRODIURIL) tablet 25 mg (not administered)  ipratropium (ATROVENT) 0.03 % nasal spray 2 spray (not administered)  propranolol (INDERAL) tablet 20 mg (not administered)  mirtazapine (REMERON) tablet 37.5 mg (not administered)  meclizine (ANTIVERT) tablet 25 mg (not administered)  losartan (COZAAR) tablet 100 mg (not administered)  ipratropium (ATROVENT HFA) inhaler 2 puff (not administered)  senna (SENOKOT) tablet 8.6 mg (not administered)  sertraline (ZOLOFT) tablet 37.5 mg (not administered)  zolpidem (AMBIEN) tablet 5 mg (not administered)  acetaminophen (TYLENOL) tablet 1,000 mg (not administered)  hydrALAZINE (APRESOLINE) injection 10 mg (not administered)   Will admit to medicine for acute volume overload  I personally performed the services described in this documentation, which was scribed in my presence. The recorded information has been reviewed and is accurate.      Cy Blamer, MD 11/12/15 [phone]

## 2015-11-12 ENCOUNTER — Encounter (HOSPITAL_COMMUNITY): Payer: Self-pay | Admitting: Emergency Medicine

## 2015-11-12 ENCOUNTER — Other Ambulatory Visit (HOSPITAL_COMMUNITY): Payer: Medicare Other

## 2015-11-12 ENCOUNTER — Emergency Department (HOSPITAL_COMMUNITY): Payer: Medicare Other

## 2015-11-12 DIAGNOSIS — I160 Hypertensive urgency: Secondary | ICD-10-CM | POA: Diagnosis present

## 2015-11-12 DIAGNOSIS — J90 Pleural effusion, not elsewhere classified: Secondary | ICD-10-CM | POA: Diagnosis present

## 2015-11-12 DIAGNOSIS — Z8711 Personal history of peptic ulcer disease: Secondary | ICD-10-CM | POA: Diagnosis not present

## 2015-11-12 DIAGNOSIS — Z66 Do not resuscitate: Secondary | ICD-10-CM | POA: Diagnosis present

## 2015-11-12 DIAGNOSIS — N183 Chronic kidney disease, stage 3 (moderate): Secondary | ICD-10-CM | POA: Diagnosis present

## 2015-11-12 DIAGNOSIS — F329 Major depressive disorder, single episode, unspecified: Secondary | ICD-10-CM | POA: Insufficient documentation

## 2015-11-12 DIAGNOSIS — R0600 Dyspnea, unspecified: Secondary | ICD-10-CM | POA: Diagnosis not present

## 2015-11-12 DIAGNOSIS — K219 Gastro-esophageal reflux disease without esophagitis: Secondary | ICD-10-CM | POA: Diagnosis present

## 2015-11-12 DIAGNOSIS — Z888 Allergy status to other drugs, medicaments and biological substances status: Secondary | ICD-10-CM | POA: Diagnosis not present

## 2015-11-12 DIAGNOSIS — I129 Hypertensive chronic kidney disease with stage 1 through stage 4 chronic kidney disease, or unspecified chronic kidney disease: Secondary | ICD-10-CM | POA: Diagnosis present

## 2015-11-12 DIAGNOSIS — F32A Depression, unspecified: Secondary | ICD-10-CM | POA: Insufficient documentation

## 2015-11-12 DIAGNOSIS — H547 Unspecified visual loss: Secondary | ICD-10-CM | POA: Diagnosis present

## 2015-11-12 DIAGNOSIS — G2581 Restless legs syndrome: Secondary | ICD-10-CM | POA: Diagnosis present

## 2015-11-12 DIAGNOSIS — Z79899 Other long term (current) drug therapy: Secondary | ICD-10-CM | POA: Diagnosis not present

## 2015-11-12 DIAGNOSIS — E559 Vitamin D deficiency, unspecified: Secondary | ICD-10-CM | POA: Diagnosis present

## 2015-11-12 DIAGNOSIS — M1990 Unspecified osteoarthritis, unspecified site: Secondary | ICD-10-CM | POA: Diagnosis present

## 2015-11-12 LAB — BASIC METABOLIC PANEL
ANION GAP: 11 (ref 5–15)
BUN: 19 mg/dL (ref 6–20)
CHLORIDE: 100 mmol/L — AB (ref 101–111)
CO2: 29 mmol/L (ref 22–32)
Calcium: 9.6 mg/dL (ref 8.9–10.3)
Creatinine, Ser: 0.98 mg/dL (ref 0.44–1.00)
GFR calc Af Amer: 55 mL/min — ABNORMAL LOW (ref >60)
GFR calc non Af Amer: 47 mL/min — ABNORMAL LOW (ref >60)
GLUCOSE: 127 mg/dL — AB (ref 65–99)
POTASSIUM: 3.6 mmol/L (ref 3.5–5.1)
Sodium: 140 mmol/L (ref 135–145)

## 2015-11-12 LAB — CBC WITH DIFFERENTIAL/PLATELET
Basophils Absolute: 0 10*3/uL (ref 0.0–0.1)
Basophils Relative: 0 %
EOS ABS: 0.2 10*3/uL (ref 0.0–0.7)
Eosinophils Relative: 2 %
HCT: 39.9 % (ref 36.0–46.0)
HEMOGLOBIN: 12.8 g/dL (ref 12.0–15.0)
LYMPHS ABS: 1.1 10*3/uL (ref 0.7–4.0)
LYMPHS PCT: 13 %
MCH: 30 pg (ref 26.0–34.0)
MCHC: 32.1 g/dL (ref 30.0–36.0)
MCV: 93.4 fL (ref 78.0–100.0)
MONOS PCT: 10 %
Monocytes Absolute: 0.8 10*3/uL (ref 0.1–1.0)
NEUTROS PCT: 75 %
Neutro Abs: 6.2 10*3/uL (ref 1.7–7.7)
Platelets: 167 10*3/uL (ref 150–400)
RBC: 4.27 MIL/uL (ref 3.87–5.11)
RDW: 13.9 % (ref 11.5–15.5)
WBC: 8.3 10*3/uL (ref 4.0–10.5)

## 2015-11-12 LAB — CBC
HCT: 39.1 % (ref 36.0–46.0)
HEMOGLOBIN: 12.9 g/dL (ref 12.0–15.0)
MCH: 29.5 pg (ref 26.0–34.0)
MCHC: 33 g/dL (ref 30.0–36.0)
MCV: 89.5 fL (ref 78.0–100.0)
PLATELETS: 159 10*3/uL (ref 150–400)
RBC: 4.37 MIL/uL (ref 3.87–5.11)
RDW: 13.5 % (ref 11.5–15.5)
WBC: 8.1 10*3/uL (ref 4.0–10.5)

## 2015-11-12 LAB — I-STAT CHEM 8, ED
BUN: 22 mg/dL — AB (ref 6–20)
CHLORIDE: 99 mmol/L — AB (ref 101–111)
Calcium, Ion: 1.22 mmol/L (ref 1.13–1.30)
Creatinine, Ser: 1 mg/dL (ref 0.44–1.00)
Glucose, Bld: 122 mg/dL — ABNORMAL HIGH (ref 65–99)
HCT: 41 % (ref 36.0–46.0)
Hemoglobin: 13.9 g/dL (ref 12.0–15.0)
Potassium: 3.8 mmol/L (ref 3.5–5.1)
SODIUM: 141 mmol/L (ref 135–145)
TCO2: 31 mmol/L (ref 0–100)

## 2015-11-12 LAB — BRAIN NATRIURETIC PEPTIDE: B Natriuretic Peptide: 383.6 pg/mL — ABNORMAL HIGH (ref 0.0–100.0)

## 2015-11-12 LAB — I-STAT TROPONIN, ED: Troponin i, poc: 0.01 ng/mL (ref 0.00–0.08)

## 2015-11-12 LAB — MRSA PCR SCREENING: MRSA BY PCR: NEGATIVE

## 2015-11-12 MED ORDER — IPRATROPIUM BROMIDE 0.02 % IN SOLN
0.5000 mg | Freq: Two times a day (BID) | RESPIRATORY_TRACT | Status: DC
  Administered 2015-11-12 – 2015-11-14 (×5): 0.5 mg via RESPIRATORY_TRACT
  Filled 2015-11-12 (×5): qty 2.5

## 2015-11-12 MED ORDER — ZOLPIDEM TARTRATE 5 MG PO TABS
5.0000 mg | ORAL_TABLET | Freq: Every evening | ORAL | Status: DC | PRN
  Administered 2015-11-12 – 2015-11-13 (×2): 5 mg via ORAL
  Filled 2015-11-12 (×2): qty 1

## 2015-11-12 MED ORDER — HYDRALAZINE HCL 20 MG/ML IJ SOLN
5.0000 mg | Freq: Once | INTRAMUSCULAR | Status: AC
  Administered 2015-11-12: 5 mg via INTRAVENOUS
  Filled 2015-11-12: qty 1

## 2015-11-12 MED ORDER — TRAMADOL HCL 50 MG PO TABS
50.0000 mg | ORAL_TABLET | Freq: Four times a day (QID) | ORAL | Status: DC | PRN
Start: 2015-11-12 — End: 2015-11-14
  Administered 2015-11-12: 50 mg via ORAL
  Filled 2015-11-12: qty 1

## 2015-11-12 MED ORDER — ACETAMINOPHEN 500 MG PO TABS
1000.0000 mg | ORAL_TABLET | Freq: Every day | ORAL | Status: DC
  Administered 2015-11-12 – 2015-11-13 (×3): 1000 mg via ORAL
  Filled 2015-11-12 (×5): qty 2

## 2015-11-12 MED ORDER — PROPRANOLOL HCL 20 MG PO TABS
20.0000 mg | ORAL_TABLET | Freq: Three times a day (TID) | ORAL | Status: DC
  Administered 2015-11-12 – 2015-11-14 (×8): 20 mg via ORAL
  Filled 2015-11-12 (×9): qty 1

## 2015-11-12 MED ORDER — HYDRALAZINE HCL 20 MG/ML IJ SOLN
10.0000 mg | INTRAMUSCULAR | Status: DC | PRN
  Administered 2015-11-13: 10 mg via INTRAVENOUS
  Filled 2015-11-12: qty 1

## 2015-11-12 MED ORDER — ENOXAPARIN SODIUM 40 MG/0.4ML ~~LOC~~ SOLN
40.0000 mg | SUBCUTANEOUS | Status: DC
  Administered 2015-11-12 – 2015-11-13 (×2): 40 mg via SUBCUTANEOUS
  Filled 2015-11-12 (×2): qty 0.4

## 2015-11-12 MED ORDER — HYDROCHLOROTHIAZIDE 25 MG PO TABS
25.0000 mg | ORAL_TABLET | ORAL | Status: DC
Start: 2015-11-14 — End: 2015-11-12

## 2015-11-12 MED ORDER — SENNA 8.6 MG PO TABS: 1.0000 | ORAL_TABLET | Freq: Two times a day (BID) | ORAL | Status: DC | PRN

## 2015-11-12 MED ORDER — FUROSEMIDE 10 MG/ML IJ SOLN
20.0000 mg | Freq: Every day | INTRAMUSCULAR | Status: DC
  Administered 2015-11-12 – 2015-11-14 (×3): 20 mg via INTRAVENOUS
  Filled 2015-11-12 (×3): qty 2

## 2015-11-12 MED ORDER — NITROGLYCERIN 2 % TD OINT: 1.0000 [in_us] | TOPICAL_OINTMENT | Freq: Four times a day (QID) | TRANSDERMAL | Status: DC

## 2015-11-12 MED ORDER — MIRTAZAPINE 7.5 MG PO TABS
37.5000 mg | ORAL_TABLET | Freq: Every day | ORAL | Status: DC
  Administered 2015-11-12 – 2015-11-13 (×2): 37.5 mg via ORAL
  Filled 2015-11-12 (×3): qty 1

## 2015-11-12 MED ORDER — FUROSEMIDE 10 MG/ML IJ SOLN
40.0000 mg | Freq: Once | INTRAMUSCULAR | Status: AC
  Administered 2015-11-12: 40 mg via INTRAVENOUS
  Filled 2015-11-12: qty 4

## 2015-11-12 MED ORDER — IPRATROPIUM BROMIDE 0.03 % NA SOLN
2.0000 | Freq: Two times a day (BID) | NASAL | Status: DC
  Filled 2015-11-12: qty 30

## 2015-11-12 MED ORDER — LOSARTAN POTASSIUM 50 MG PO TABS
100.0000 mg | ORAL_TABLET | Freq: Every day | ORAL | Status: DC
  Administered 2015-11-12 – 2015-11-14 (×3): 100 mg via ORAL
  Filled 2015-11-12 (×3): qty 2

## 2015-11-12 MED ORDER — MECLIZINE HCL 25 MG PO TABS
25.0000 mg | ORAL_TABLET | Freq: Three times a day (TID) | ORAL | Status: DC | PRN
  Filled 2015-11-12: qty 1

## 2015-11-12 MED ORDER — SERTRALINE HCL 25 MG PO TABS
37.5000 mg | ORAL_TABLET | Freq: Every day | ORAL | Status: DC
  Administered 2015-11-12 – 2015-11-14 (×3): 37.5 mg via ORAL
  Filled 2015-11-12 (×3): qty 1.5

## 2015-11-12 MED ORDER — MIRTAZAPINE 7.5 MG PO TABS: 37.5000 mg | ORAL_TABLET | Freq: Every day | ORAL | Status: DC

## 2015-11-12 MED ORDER — IPRATROPIUM BROMIDE HFA 17 MCG/ACT IN AERS: 2.0000 | INHALATION_SPRAY | Freq: Two times a day (BID) | RESPIRATORY_TRACT | Status: DC

## 2015-11-12 MED ORDER — HYDRALAZINE HCL 25 MG PO TABS
25.0000 mg | ORAL_TABLET | Freq: Three times a day (TID) | ORAL | Status: DC
  Filled 2015-11-12 (×3): qty 1

## 2015-11-12 NOTE — Progress Notes (Signed)
[facility]                                                                              Patient Demographics  Alyssa [name], is a 82 y.o. female, DOB - [birth year], ZOX:[id number]  Admit date - 11/11/2015   Admitting Physician Hillary BowJared M Gardner, DO  Outpatient Primary MD for the patient is Florentina JennyRIPP, HENRY, MD  LOS - 0   Chief Complaint  Patient presents with  . Shortness of Breath      HPI on 11/12/2015 by Dr. Lyda PeroneJared Gardner Alyssa DoryRuth Brittle is a 82 y.o. female with h/o HTN, patient presents to ED with 2 week history of mild SOB. Her NH sent her in after CXR there showed PNA vs CHF with pleural effusion. She has tried no meds for SOB. For HTN she is on multiple PO medications. In the ED it becomes very clear rapidly that patients SOB is due to acute CHF from hypertensive urgency. Her systolic BPs thus far have ben measured multiple times both by machine and manually to be between 224 and 252. She denies any chest pain, N/V/D, no abd pain, no fever, no wheezing.  Assessment & Plan   Dyspnea/Bilateral Pleural Effusions -Will obtain BNP and echocardiogarm -Patient does have LE edema as well as mild vascular congestion/small-mod pleural effusions on CXR -No history of CHF -Continue supplemental oxygen to maintain sats >92% -Was given one dose of lasix, will order 20mg  IV daily -Monitor daily weights, intake/output  Accelerated hypertension -Will discontinue HCTZ -Continue inderal, losartan -Continue hydralazine PRN -Will place on lasix IV  Depression -Continue zoloft  Code Status: DNR  Family Communication: None at bedside  Disposition Plan: Admitted. Pending echocardiogram  Time Spent in minutes   30 minutes  Procedures  None  Consults   None  DVT Prophylaxis  Lovenox  Lab Results  Component Value Date   PLT 159 11/12/2015    Medications  Scheduled Meds: . acetaminophen  1,000 mg Oral QHS  . enoxaparin (LOVENOX) injection  40 mg Subcutaneous Q24H  .  [START ON 11/14/2015] hydrochlorothiazide  25 mg Oral Q M,W,F  . ipratropium  2 spray Each Nare BID  . ipratropium  0.5 mg Nebulization BID  . losartan  100 mg Oral Daily  . mirtazapine  37.5 mg Oral QHS  . propranolol  20 mg Oral TID  . sertraline  37.5 mg Oral Daily   Continuous Infusions:  PRN Meds:.hydrALAZINE, meclizine, senna, traMADol, zolpidem  Antibiotics    Anti-infectives    None      Subjective:   Alyssa DoryRuth Sandlin seen and examined today.  Feels breathing has improved mildly.  Denies  dizziness, chest pain,  abdominal pain, N/V/D/C.  Objective:   Filed Vitals:   11/12/15 0158 11/12/15 0245 11/12/15 0400 11/12/15 0842  BP: 240/90 182/72 181/72   Pulse:  88 71   Temp:  97.5 F (36.4 C) 97.5 F (36.4 C)   TempSrc:  Oral Oral   Resp:  18 20   Height:   5\' 6"  (1.676 m)   Weight:   58.4 kg (128 lb 12 oz)   SpO2:  94% 98% 96%    Wt Readings from Last  3 Encounters:  11/12/15 58.4 kg (128 lb 12 oz)     Intake/Output Summary (Last 24 hours) at 11/12/15 1319 Last data filed at 11/12/15 1100  Gross per 24 hour  Intake    240 ml  Output   1500 ml  Net  -1260 ml    Exam  General: Well developed, well nourished, NAD, appears stated age  HEENT: NCAT,mucous membranes moist.   Cardiovascular: S1 S2 auscultated, no rubs, murmurs or gallops. Regular rate and rhythm.  Respiratory: Diminished but clear, no wheezing  Abdomen: Soft, nontender, nondistended, + bowel sounds  Extremities: warm dry without cyanosis clubbing. 2+edema LE B/L   Neuro: AAOx3, nonfocal  Psych: Normal affect and demeanor with intact judgement and insight  Data Review   Micro Results Recent Results (from the past 240 hour(s))  MRSA PCR Screening     Status: None   Collection Time: 11/12/15  4:58 AM  Result Value Ref Range Status   MRSA by PCR NEGATIVE NEGATIVE Final    Comment:        The GeneXpert MRSA Assay (FDA approved for NASAL specimens only), is one component of  a comprehensive MRSA colonization surveillance program. It is not intended to diagnose MRSA infection nor to guide or monitor treatment for MRSA infections.     Radiology Reports Dg Chest 2 View  11/12/2015  CLINICAL DATA:  82 year old female with shortness of breath for 2 weeks, worse today. EXAM: CHEST  2 VIEW COMPARISON:  04/06/2015 FINDINGS: Bilateral pleural effusions, moderate on left and small on the right. There are associated bibasilar opacities. Heart appears prominent size. Mild vascular congestion without overt edema. Calcified granuloma in the left lung with calcified left hilar nodes again seen. No pneumothorax. IMPRESSION: Bilateral pleural effusions, left greater than right. Mild vascular congestion. Correlation for fluid overload recommended. Electronically Signed   By: Rubye Oaks M.D.   On: 11/12/2015 01:07    CBC  Recent Labs Lab 11/12/15 0042 11/12/15 0058 11/12/15 0550  WBC 8.3  --  8.1  HGB 12.8 13.9 12.9  HCT 39.9 41.0 39.1  PLT 167  --  159  MCV 93.4  --  89.5  MCH 30.0  --  29.5  MCHC 32.1  --  33.0  RDW 13.9  --  13.5  LYMPHSABS 1.1  --   --   MONOABS 0.8  --   --   EOSABS 0.2  --   --   BASOSABS 0.0  --   --     Chemistries   Recent Labs Lab 11/12/15 0058 11/12/15 0550  NA 141 140  K 3.8 3.6  CL 99* 100*  CO2  --  29  GLUCOSE 122* 127*  BUN 22* 19  CREATININE 1.00 0.98  CALCIUM  --  9.6   ------------------------------------------------------------------------------------------------------------------ estimated creatinine clearance is 31 mL/min (by C-G formula based on Cr of 0.98). ------------------------------------------------------------------------------------------------------------------ No results for input(s): HGBA1C in the last 72 hours. ------------------------------------------------------------------------------------------------------------------ No results for input(s): CHOL, HDL, LDLCALC, TRIG, CHOLHDL, LDLDIRECT  in the last 72 hours. ------------------------------------------------------------------------------------------------------------------ No results for input(s): TSH, T4TOTAL, T3FREE, THYROIDAB in the last 72 hours.  Invalid input(s): FREET3 ------------------------------------------------------------------------------------------------------------------ No results for input(s): VITAMINB12, FOLATE, FERRITIN, TIBC, IRON, RETICCTPCT in the last 72 hours.  Coagulation profile No results for input(s): INR, PROTIME in the last 168 hours.  No results for input(s): DDIMER in the last 72 hours.  Cardiac Enzymes No results for input(s): CKMB, TROPONINI, MYOGLOBIN in the last 168 hours.  Invalid  input(s): CK ------------------------------------------------------------------------------------------------------------------ Invalid input(s): POCBNP    Tashyra Adduci D.O. on 11/12/2015 at 1:19 PM  Between 7am to 7pm - Pager - [phone]  After 7pm go to [email or web address] - password [email or web address]  And look for the night coverage person covering for me after hours  [facility] Office  [phone]

## 2015-11-12 NOTE — ED Notes (Signed)
Timer started 2:39am

## 2015-11-12 NOTE — H&P (Signed)
[facility] History and Physical  Alyssa [name] XBJ:[id number] DOB: 1916/09/22 DOA: 11/11/2015  Referring physician: EDP PCP: Florentina JennyRIPP, HENRY, MD   Chief Complaint: SOB   HPI: Alyssa DoryRuth [name] is a 82 y.o. female with h/o HTN, patient presents to ED with 2 week history of mild SOB.  Her NH sent her in after CXR there showed PNA vs CHF with pleural effusion.  She has tried no meds for SOB.  For HTN she is on multiple PO medications.  In the ED it becomes very clear rapidly that patients SOB is due to acute CHF from hypertensive urgency.  Her systolic BPs thus far have ben measured multiple times both by machine and manually to be between 224 and 252.  She denies any chest pain, N/V/D, no abd pain, no fever, no wheezing.  Review of Systems: Systems reviewed.  As above, otherwise negative  Past Medical History  Diagnosis Date  . Hypertension   . Renal disorder     CKD stage 3  . Dizziness   . Foot drop   . Bronchitis   . Urinary frequency   . Candidal intertrigo   . Altered mental status   . Fatigue   . Chronic GI hemorrhage   . Edema   . Gait disturbance   . Neuropathy (HCC)   . Vision impairment   . Dry eyes   . Thrush   . GERD (gastroesophageal reflux disease)   . Pancreatitis   . Abdominal pain   . Rectal bleed   . Shoulder pain   . Sinusitis   . Allergic rhinitis   . Hypokalemia   . Hyponatremia   . Hyperglycemia   . Tremor   . Hypercalcemia   . Neck pain   . Vitamin D deficiency   . Depression   . Osteoarthritis   . Abnormal gait   . Intermittent claudication (HCC)   . Onychomycosis   . Constipation   . Dysphagia   . Insomnia   . Cataract   . Restless leg syndrome   . Peptic ulcer disease with hemorrhage   . Diverticulosis   . Gastritis   . Hiatal hernia    Past Surgical History  Procedure Laterality Date  . Cholecystectomy    . Appendectomy    . Abdominal hysterectomy     Social History:  reports that she does not drink alcohol. Her  tobacco and drug histories are not on file.  Allergies  Allergen Reactions  . Iron Other (See Comments)    *per mar*  . Voltaren [Diclofenac Sodium] Other (See Comments)    *per mar*    History reviewed. No pertinent family history.   Prior to Admission medications   Medication Sig Start Date End Date Taking? Authorizing Provider  acetaminophen (TYLENOL) 500 MG tablet Take 1,000 mg by mouth at bedtime. May also take twice daily as needed for pain   Yes Historical Provider, MD  ergocalciferol (VITAMIN D2) 50000 UNITS capsule Take 50,000 Units by mouth every 30 (thirty) days.    Yes Historical Provider, MD  guaiFENesin (ROBITUSSIN) 100 MG/5ML liquid Take 200 mg by mouth 2 (two) times daily as needed for cough.   Yes Historical Provider, MD  hydrALAZINE (APRESOLINE) 25 MG tablet Take 25 mg by mouth 3 (three) times daily.   Yes Historical Provider, MD  hydrochlorothiazide (HYDRODIURIL) 25 MG tablet Take 25 mg by mouth every Monday, Wednesday, and Friday.    Yes Historical Provider, MD  ipratropium (ATROVENT) 0.03 % nasal spray  Place 2 sprays into both nostrils 2 (two) times daily.   Yes Historical Provider, MD  IPRATROPIUM BROMIDE HFA IN Inhale 2 puffs into the lungs 2 (two) times daily.   Yes Historical Provider, MD  losartan (COZAAR) 100 MG tablet Take 100 mg by mouth daily.   Yes Historical Provider, MD  meclizine (ANTIVERT) 25 MG tablet Take 25 mg by mouth 3 (three) times daily as needed for dizziness.   Yes Historical Provider, MD  mirtazapine (REMERON) 30 MG tablet Take 37.5 mg by mouth at bedtime. Take along with 7.5 mg to equal 37.5 mg nightly   Yes Historical Provider, MD  mirtazapine (REMERON) 7.5 MG tablet Take 37.5 mg by mouth at bedtime. Take along with 30 mg to equal 37.5 mg nightly   Yes Historical Provider, MD  propranolol (INDERAL) 20 MG tablet Take 20 mg by mouth 3 (three) times daily.   Yes Historical Provider, MD  senna (SENOKOT) 8.6 MG TABS tablet Take 1 tablet by mouth  every 12 (twelve) hours as needed for mild constipation.   Yes Historical Provider, MD  sertraline (ZOLOFT) 25 MG tablet Take 37.5 mg by mouth daily. Takes 1 and 1/2 tablet   Yes Historical Provider, MD  zolpidem (AMBIEN) 5 MG tablet Take 5 mg by mouth at bedtime as needed for sleep.   Yes Historical Provider, MD   Physical Exam: Filed Vitals:   11/11/15 2340 11/12/15 0158  BP: 224/96 240/90  Pulse:    Temp:    Resp:      BP 240/90 mmHg  Pulse 62  Temp(Src) 97.2 F (36.2 C) (Oral)  Resp 20  SpO2 100%  General Appearance:    Alert, oriented, no distress, appears stated age, mild dyspnea, manual BP 240/90 right arm  Head:    Normocephalic, atraumatic  Eyes:    PERRL, EOMI, sclera non-icteric        Nose:   Nares without drainage or epistaxis. Mucosa, turbinates normal  Throat:   Moist mucous membranes. Oropharynx without erythema or exudate.  Neck:   Supple. No carotid bruits.  No thyromegaly.  No lymphadenopathy.   Back:     No CVA tenderness, no spinal tenderness  Lungs:     Clear to auscultation bilaterally, without wheezes, rhonchi or rales  Chest wall:    No tenderness to palpitation  Heart:    Regular rate and rhythm without murmurs, gallops, rubs  Abdomen:     Soft, non-tender, nondistended, normal bowel sounds, no organomegaly  Genitalia:    deferred  Rectal:    deferred  Extremities:   No clubbing, cyanosis or edema.  Pulses:   2+ and symmetric all extremities  Skin:   Skin color, texture, turgor normal, no rashes or lesions  Lymph nodes:   Cervical, supraclavicular, and axillary nodes normal  Neurologic:   CNII-XII intact. Normal strength, sensation and reflexes      throughout    Labs on Admission:  Basic Metabolic Panel:  Recent Labs Lab 11/12/15 0058  NA 141  K 3.8  CL 99*  GLUCOSE 122*  BUN 22*  CREATININE 1.00   Liver Function Tests: No results for input(s): AST, ALT, ALKPHOS, BILITOT, PROT, ALBUMIN in the last 168 hours. No results for input(s):  LIPASE, AMYLASE in the last 168 hours. No results for input(s): AMMONIA in the last 168 hours. CBC:  Recent Labs Lab 11/12/15 0042 11/12/15 0058  WBC 8.3  --   NEUTROABS 6.2  --   HGB 12.8 13.9  HCT 39.9 41.0  MCV 93.4  --   PLT 167  --    Cardiac Enzymes: No results for input(s): CKTOTAL, CKMB, CKMBINDEX, TROPONINI in the last 168 hours.  BNP (last 3 results) No results for input(s): PROBNP in the last 8760 hours. CBG: No results for input(s): GLUCAP in the last 168 hours.  Radiological Exams on Admission: Dg Chest 2 View  11/12/2015  CLINICAL DATA:  82 year old female with shortness of breath for 2 weeks, worse today. EXAM: CHEST  2 VIEW COMPARISON:  04/06/2015 FINDINGS: Bilateral pleural effusions, moderate on left and small on the right. There are associated bibasilar opacities. Heart appears prominent size. Mild vascular congestion without overt edema. Calcified granuloma in the left lung with calcified left hilar nodes again seen. No pneumothorax. IMPRESSION: Bilateral pleural effusions, left greater than right. Mild vascular congestion. Correlation for fluid overload recommended. Electronically Signed   By: Rubye Oaks M.D.   On: 11/12/2015 01:07    EKG: Independently reviewed.  Assessment/Plan Principal Problem:   Hypertensive urgency   1. Hypertensive urgency - 1. No neurologic deficit to suggest CVA hemorrhagic or otherwise, patient is AAOx3, and mentating well if a little anxious about everything that is going on. 2. No CP or mediastinal widening to suggest dissection. 3. Continue home BP meds (takes 4 different ones) except hydralazine PO 4. Hydralazine  IV x1 now 5. Hydralazine  IV Q4H PRN for SBP > 180 6. May need to add Cardene or labetalol gtt if hydralazine ineffective after first dose. 7. Getting  lasix X1 in ED 8. BMP and CBC in AM.    Code Status: DNR Family Communication: Daughter at bedside Disposition Plan: Admit to inpatient    Time spent: 70 min  GARDNER, JARED M. [facility] Pager [phone]  If 7AM-7PM, please contact the day team taking care of the patient [email or web address] Password [email or web address] 11/12/2015, 2:08 AM

## 2015-11-12 NOTE — ED Notes (Signed)
20 mg given- per provider

## 2015-11-12 NOTE — ED Notes (Signed)
Patient transported to X-ray 

## 2015-11-12 NOTE — ED Notes (Signed)
In ct  

## 2015-11-13 ENCOUNTER — Inpatient Hospital Stay (HOSPITAL_COMMUNITY): Payer: Medicare Other

## 2015-11-13 DIAGNOSIS — R0600 Dyspnea, unspecified: Secondary | ICD-10-CM

## 2015-11-13 LAB — CBC
HEMATOCRIT: 37.4 % (ref 36.0–46.0)
Hemoglobin: 11.7 g/dL — ABNORMAL LOW (ref 12.0–15.0)
MCH: 29.5 pg (ref 26.0–34.0)
MCHC: 31.3 g/dL (ref 30.0–36.0)
MCV: 94.2 fL (ref 78.0–100.0)
Platelets: 182 10*3/uL (ref 150–400)
RBC: 3.97 MIL/uL (ref 3.87–5.11)
RDW: 13.8 % (ref 11.5–15.5)
WBC: 5.6 10*3/uL (ref 4.0–10.5)

## 2015-11-13 LAB — ECHOCARDIOGRAM COMPLETE
Height: 66 in
Weight: 2059.98 oz

## 2015-11-13 LAB — BASIC METABOLIC PANEL
Anion gap: 10 (ref 5–15)
BUN: 27 mg/dL — AB (ref 6–20)
CHLORIDE: 101 mmol/L (ref 101–111)
CO2: 32 mmol/L (ref 22–32)
CREATININE: 1.12 mg/dL — AB (ref 0.44–1.00)
Calcium: 9.2 mg/dL (ref 8.9–10.3)
GFR calc Af Amer: 47 mL/min — ABNORMAL LOW (ref >60)
GFR calc non Af Amer: 40 mL/min — ABNORMAL LOW (ref >60)
Glucose, Bld: 101 mg/dL — ABNORMAL HIGH (ref 65–99)
Potassium: 3.7 mmol/L (ref 3.5–5.1)
Sodium: 143 mmol/L (ref 135–145)

## 2015-11-13 MED ORDER — ENOXAPARIN SODIUM 30 MG/0.3ML ~~LOC~~ SOLN
30.0000 mg | SUBCUTANEOUS | Status: DC
  Administered 2015-11-14: 30 mg via SUBCUTANEOUS
  Filled 2015-11-13: qty 0.3

## 2015-11-13 NOTE — Progress Notes (Signed)
[facility]                                                                              Patient Demographics  Alyssa [name], is a 82 y.o. female, DOB - [birth year], ZOX:[id number]  Admit date - 11/11/2015   Admitting Physician Hillary Bow, DO  Outpatient Primary MD for the patient is Florentina Jenny, MD  LOS - 1   Chief Complaint  Patient presents with  . Shortness of Breath      HPI on 11/12/2015 by Dr. Lyda Perone Alyssa [name] is a 82 y.o. female with h/o HTN, patient presents to ED with 2 week history of mild SOB. Her NH sent her in after CXR there showed PNA vs CHF with pleural effusion. She has tried no meds for SOB. For HTN she is on multiple PO medications. In the ED it becomes very clear rapidly that patients SOB is due to acute CHF from hypertensive urgency. Her systolic BPs thus far have ben measured multiple times both by machine and manually to be between 224 and 252. She denies any chest pain, N/V/D, no abd pain, no fever, no wheezing.  Assessment & Plan   Dyspnea/Bilateral Pleural Effusions -BNP upon admission 383.6 -Pending echocardiogarm -Patient does have LE edema as well as mild vascular congestion/small-mod pleural effusions on CXR. LE edema improving.  -No history of CHF -Continue supplemental oxygen to maintain sats >92% -Was given one dose of lasix, will order  IV daily -Monitor daily weights, intake/output -UOP over past 24 hours 1300cc  Accelerated hypertension -Will discontinue HCTZ -Continue inderal, losartan -Continue hydralazine PRN -Continue lasix IV  Depression -Continue zoloft  Code Status: DNR  Family Communication: None at bedside  Disposition Plan: Admitted. Pending echocardiogram  Time Spent in minutes   30 minutes  Procedures  Echocardiogram  Consults   None  DVT Prophylaxis  Lovenox  Lab Results  Component Value Date   PLT 182 11/13/2015    Medications  Scheduled Meds: . acetaminophen  1,000 mg  Oral QHS  . enoxaparin (LOVENOX) injection  40 mg Subcutaneous Q24H  . furosemide  20 mg Intravenous Daily  . ipratropium  2 spray Each Nare BID  . ipratropium  0.5 mg Nebulization BID  . losartan  100 mg Oral Daily  . mirtazapine  37.5 mg Oral QHS  . propranolol  20 mg Oral TID  . sertraline  37.5 mg Oral Daily   Continuous Infusions:  PRN Meds:.hydrALAZINE, meclizine, senna, traMADol, zolpidem  Antibiotics    Anti-infectives    None      Subjective:   Alyssa [name] seen and examined today.  Feels breathing has improved. Feels leg edema has improved as well. Very sleepy this morning.  Denies  dizziness, chest pain,  abdominal pain, N/V/D/C.  Objective:   Filed Vitals:   11/12/15 2048 11/12/15 2111 11/13/15 0615 11/13/15 0750  BP: 172/87  188/51   Pulse: 65  63   Temp: 98 F (36.7 C)  97.6 F (36.4 C)   TempSrc: Oral  Axillary   Resp: 18  16   Height:      Weight:      SpO2: 92% 92% 98% 95%  Wt Readings from Last 3 Encounters:  11/12/15 58.4 kg (128 lb 12 oz)     Intake/Output Summary (Last 24 hours) at 11/13/15 1017 Last data filed at 11/12/15 2200  Gross per 24 hour  Intake    480 ml  Output   1000 ml  Net   -520 ml    Exam  General: Well developed, well nourished, NAD  HEENT: NCAT,mucous membranes moist.   Cardiovascular: S1 S2 auscultated, RRR  Respiratory: Diminished but clear, no wheezing  Abdomen: Soft, nontender, nondistended, + bowel sounds  Extremities: warm dry without cyanosis clubbing. 2+edema LE B/L- improving   Neuro: AAOx3, nonfocal  Psych: Normal affect and demeanor  Data Review   Micro Results Recent Results (from the past 240 hour(s))  MRSA PCR Screening     Status: None   Collection Time: 11/12/15  4:58 AM  Result Value Ref Range Status   MRSA by PCR NEGATIVE NEGATIVE Final    Comment:        The GeneXpert MRSA Assay (FDA approved for NASAL specimens only), is one component of a comprehensive MRSA  colonization surveillance program. It is not intended to diagnose MRSA infection nor to guide or monitor treatment for MRSA infections.     Radiology Reports Dg Chest 2 View  11/12/2015  CLINICAL DATA:  82 year old female with shortness of breath for 2 weeks, worse today. EXAM: CHEST  2 VIEW COMPARISON:  04/06/2015 FINDINGS: Bilateral pleural effusions, moderate on left and small on the right. There are associated bibasilar opacities. Heart appears prominent size. Mild vascular congestion without overt edema. Calcified granuloma in the left lung with calcified left hilar nodes again seen. No pneumothorax. IMPRESSION: Bilateral pleural effusions, left greater than right. Mild vascular congestion. Correlation for fluid overload recommended. Electronically Signed   By: Rubye Oaks M.D.   On: 11/12/2015 01:07    CBC  Recent Labs Lab 11/12/15 0042 11/12/15 0058 11/12/15 0550 11/13/15 0600  WBC 8.3  --  8.1 5.6  HGB 12.8 13.9 12.9 11.7*  HCT 39.9 41.0 39.1 37.4  PLT 167  --  159 182  MCV 93.4  --  89.5 94.2  MCH 30.0  --  29.5 29.5  MCHC 32.1  --  33.0 31.3  RDW 13.9  --  13.5 13.8  LYMPHSABS 1.1  --   --   --   MONOABS 0.8  --   --   --   EOSABS 0.2  --   --   --   BASOSABS 0.0  --   --   --     Chemistries   Recent Labs Lab 11/12/15 0058 11/12/15 0550 11/13/15 0600  NA 141 140 143  K 3.8 3.6 3.7  CL 99* 100* 101  CO2  --  29 32  GLUCOSE 122* 127* 101*  BUN 22* 19 27*  CREATININE 1.00 0.98 1.12*  CALCIUM  --  9.6 9.2   ------------------------------------------------------------------------------------------------------------------ estimated creatinine clearance is 27.1 mL/min (by C-G formula based on Cr of 1.12). ------------------------------------------------------------------------------------------------------------------ No results for input(s): HGBA1C in the last 72  hours. ------------------------------------------------------------------------------------------------------------------ No results for input(s): CHOL, HDL, LDLCALC, TRIG, CHOLHDL, LDLDIRECT in the last 72 hours. ------------------------------------------------------------------------------------------------------------------ No results for input(s): TSH, T4TOTAL, T3FREE, THYROIDAB in the last 72 hours.  Invalid input(s): FREET3 ------------------------------------------------------------------------------------------------------------------ No results for input(s): VITAMINB12, FOLATE, FERRITIN, TIBC, IRON, RETICCTPCT in the last 72 hours.  Coagulation profile No results for input(s): INR, PROTIME in the last 168 hours.  No results for input(s): DDIMER  in the last 72 hours.  Cardiac Enzymes No results for input(s): CKMB, TROPONINI, MYOGLOBIN in the last 168 hours.  Invalid input(s): CK ------------------------------------------------------------------------------------------------------------------ Invalid input(s): POCBNP    Alyssa [name] D.O. on 11/13/2015 at 10:17 AM  Between 7am to 7pm - Pager - [phone]  After 7pm go to [email or web address] - password [email or web address]  And look for the night coverage person covering for me after hours  [facility] Office  [phone]

## 2015-11-13 NOTE — Progress Notes (Signed)
  Echocardiogram 2D Echocardiogram has been performed.  Alyssa SavoyCasey N Tel [name] 11/13/2015, 11:08 AM

## 2015-11-14 LAB — BASIC METABOLIC PANEL
Anion gap: 9 (ref 5–15)
BUN: 36 mg/dL — AB (ref 6–20)
CHLORIDE: 101 mmol/L (ref 101–111)
CO2: 32 mmol/L (ref 22–32)
CREATININE: 1.19 mg/dL — AB (ref 0.44–1.00)
Calcium: 9.1 mg/dL (ref 8.9–10.3)
GFR calc Af Amer: 43 mL/min — ABNORMAL LOW (ref >60)
GFR calc non Af Amer: 37 mL/min — ABNORMAL LOW (ref >60)
GLUCOSE: 107 mg/dL — AB (ref 65–99)
Potassium: 3.6 mmol/L (ref 3.5–5.1)
Sodium: 142 mmol/L (ref 135–145)

## 2015-11-14 LAB — CBC
HEMATOCRIT: 37.5 % (ref 36.0–46.0)
HEMOGLOBIN: 11.9 g/dL — AB (ref 12.0–15.0)
MCH: 29.8 pg (ref 26.0–34.0)
MCHC: 31.7 g/dL (ref 30.0–36.0)
MCV: 93.8 fL (ref 78.0–100.0)
Platelets: 180 10*3/uL (ref 150–400)
RBC: 4 MIL/uL (ref 3.87–5.11)
RDW: 13.9 % (ref 11.5–15.5)
WBC: 5.5 10*3/uL (ref 4.0–10.5)

## 2015-11-14 MED ORDER — FUROSEMIDE 20 MG PO TABS: 20.0000 mg | ORAL_TABLET | Freq: Every day | ORAL | Status: DC

## 2015-11-14 NOTE — Progress Notes (Signed)
[facility] All questions [facility]. Patient will be transported back in stable condition with daughter.

## 2015-11-14 NOTE — Discharge Instructions (Signed)
Hypertension Hypertension, commonly called high blood pressure, is when the force of blood pumping through your arteries is too strong. Your arteries are the blood vessels that carry blood from your heart throughout your body. A blood pressure reading consists of a higher number over a lower number, such as 110/72. The higher number (systolic) is the pressure inside your arteries when your heart pumps. The lower number (diastolic) is the pressure inside your arteries when your heart relaxes. Ideally you want your blood pressure below 120/80. Hypertension forces your heart to work harder to pump blood. Your arteries may become narrow or stiff. Having untreated or uncontrolled hypertension can cause heart attack, stroke, kidney disease, and other problems. RISK FACTORS Some risk factors for high blood pressure are controllable. Others are not.  Risk factors you cannot control include:   Race. You may be at higher risk if you are African American.  Age. Risk increases with age.  Gender. Men are at higher risk than women before age 45 years. After age 65, women are at higher risk than men. Risk factors you can control include:  Not getting enough exercise or physical activity.  Being overweight.  Getting too much fat, sugar, calories, or salt in your diet.  Drinking too much alcohol. SIGNS AND SYMPTOMS Hypertension does not usually cause signs or symptoms. Extremely high blood pressure (hypertensive crisis) may cause headache, anxiety, shortness of breath, and nosebleed. DIAGNOSIS To check if you have hypertension, your health care provider will measure your blood pressure while you are seated, with your arm held at the level of your heart. It should be measured at least twice using the same arm. Certain conditions can cause a difference in blood pressure between your right and left arms. A blood pressure reading that is higher than normal on one occasion does not mean that you need treatment. If  it is not clear whether you have high blood pressure, you may be asked to return on a different day to have your blood pressure checked again. Or, you may be asked to monitor your blood pressure at home for 1 or more weeks. TREATMENT Treating high blood pressure includes making lifestyle changes and possibly taking medicine. Living a healthy lifestyle can help lower high blood pressure. You may need to change some of your habits. Lifestyle changes may include:  Following the DASH diet. This diet is high in fruits, vegetables, and whole grains. It is low in salt, red meat, and added sugars.  Keep your sodium intake below 2,300 mg per day.  Getting at least 30-45 minutes of aerobic exercise at least 4 times per week.  Losing weight if necessary.  Not smoking.  Limiting alcoholic beverages.  Learning ways to reduce stress. Your health care provider may prescribe medicine if lifestyle changes are not enough to get your blood pressure under control, and if one of the following is true:  You are 18-59 years of age and your systolic blood pressure is above 140.  You are 60 years of age or older, and your systolic blood pressure is above 150.  Your diastolic blood pressure is above 90.  You have diabetes, and your systolic blood pressure is over 140 or your diastolic blood pressure is over 90.  You have kidney disease and your blood pressure is above 140/90.  You have heart disease and your blood pressure is above 140/90. Your personal target blood pressure may vary depending on your medical conditions, your age, and other factors. HOME CARE INSTRUCTIONS    Have your blood pressure rechecked as directed by your health care provider.   Take medicines only as directed by your health care provider. Follow the directions carefully. Blood pressure medicines must be taken as prescribed. The medicine does not work as well when you skip doses. Skipping doses also puts you at risk for  problems.  Do not smoke.   Monitor your blood pressure at home as directed by your health care provider. SEEK MEDICAL CARE IF:   You think you are having a reaction to medicines taken.  You have recurrent headaches or feel dizzy.  You have swelling in your ankles.  You have trouble with your vision. SEEK IMMEDIATE MEDICAL CARE IF:  You develop a severe headache or confusion.  You have unusual weakness, numbness, or feel faint.  You have severe chest or abdominal pain.  You vomit repeatedly.  You have trouble breathing. MAKE SURE YOU:   Understand these instructions.  Will watch your condition.  Will get help right away if you are not doing well or get worse.   This information is not intended to replace advice given to you by your health care provider. Make sure you discuss any questions you have with your health care provider.   Document Released: 08/20/2005 Document Revised: 01/04/2015 Document Reviewed: 06/12/2013 Elsevier Interactive Patient Education 2016 [employer]  

## 2015-11-14 NOTE — Evaluation (Addendum)
Physical Therapy Evaluation Patient Details Name: Alyssa [name] MRN: [id number] DOB: 1916-12-09 Today's Date: 11/14/2015   History of Present Illness  82 y.o. female with h/o B foot drop admitted with hypertensive urgency and pleural effusion.   Clinical Impression  Pt ambulated 180' with RW and supervision. HR 71 with ambulation, SaO2 96% on RA, no dyspnea. From PT standpoint she is ready to return to ALF with assistance for ADLs and HHPT.      Follow Up Recommendations Home health PT    Equipment Recommendations  None recommended by PT    Recommendations for Other Services       Precautions / Restrictions Precautions Precautions: Fall Precaution Comments: pt reports "a couple" of falls in past year Restrictions Weight Bearing Restrictions: No      Mobility  Bed Mobility               General bed mobility comments: NT-up in recliner  Transfers Overall transfer level: Modified independent Equipment used: Rolling walker (2 wheeled) Transfers: Sit to/from Stand Sit to Stand: Supervision         General transfer comment: supervision due to h/o falls, no physical assist needed  Ambulation/Gait Ambulation/Gait assistance: Supervision Ambulation Distance (Feet): 180 Feet Assistive device: Rolling walker (2 wheeled) Gait Pattern/deviations: WFL(Within Functional Limits)   Gait velocity interpretation: at or above normal speed for age/gender General Gait Details: steady with RW, HR 71, SaO2 96% on RA, 0/4 dyspnea; assist to don B AFOs  Stairs            Wheelchair Mobility    Modified Rankin (Stroke Patients Only)       Balance Overall balance assessment: History of Falls (steady with RW)                                           Pertinent Vitals/Pain Pain Assessment: No/denies pain    Home Living Family/patient expects to be discharged to:: Assisted living               Home Equipment: Walker - 2 wheels;Wheelchair -  power;Other (comment) (B AFOs)      Prior Function Level of Independence: Needs assistance   Gait / Transfers Assistance Needed: independent with RW in apt, power WC to go to dining room  ADL's / Homemaking Assistance Needed: assistance for bathing/dressing        Hand Dominance        Extremity/Trunk Assessment   Upper Extremity Assessment: Overall WFL for tasks assessed           Lower Extremity Assessment: Overall WFL for tasks assessed  edema B feet/ankles noted (pt reports this is chronic); B foot drop. Knee ext +4/5 B.  Sensation intact B feet.       Cervical / Trunk Assessment: Normal  Communication   Communication: HOH  Cognition Arousal/Alertness: Awake/alert Behavior During Therapy: WFL for tasks assessed/performed Overall Cognitive Status: Within Functional Limits for tasks assessed                      General Comments      Exercises        Assessment/Plan    PT Assessment All further PT needs can be met in the next venue of care  PT Diagnosis Generalized weakness   PT Problem List Decreased activity tolerance  PT Treatment Interventions  PT Goals (Current goals can be found in the Care Plan section) Acute Rehab PT Goals Patient Stated Goal: pt likes to read and sew PT Goal Formulation: With patient/family Potential to Achieve Goals: Good    Frequency     Barriers to discharge        Co-evaluation               End of Session Equipment Utilized During Treatment: Gait belt Activity Tolerance: Patient tolerated treatment well Patient left: in chair;with call bell/phone within reach;with family/visitor present;with chair alarm set Nurse Communication: Mobility status         Time: 3968-8648 PT Time Calculation (min) (ACUTE ONLY): 27 min   Charges:   PT Evaluation $PT Eval Low Complexity: 1 Procedure PT Treatments $Gait Training: 8-22 mins   PT G Codes:        Philomena Doheny 11/14/2015, 1:50  PM [phone]

## 2015-11-14 NOTE — Care Management Note (Signed)
Case Management Note  Patient Details  Name: Alyssa DoryRuth [name] MRN: [id number] Date of Birth: 1916-11-30  Subjective/Objective:82 y/o f admitted w/htn urgency. From [facility]. [facility] provides own HHPT if ordered.                    Action/Plan:d/c plan return back to ALF.   Expected Discharge Date:                Expected Discharge Plan:  Home w Home Health Services  In-House Referral:     Discharge planning Services  CM Consult  Post Acute Care Choice:    Choice offered to:     DME Arranged:    DME Agency:     HH Arranged:    HH Agency:     Status of Service:  In process, will continue to follow  Medicare Important Message Given:    Date Medicare IM Given:    Medicare IM give by:    Date Additional Medicare IM Given:    Additional Medicare Important Message give by:     If discussed at Long Length of Stay Meetings, dates discussed:    Additional Comments:  Lanier ClamMahabir, Kaevon Cotta, RN 11/14/2015, 11:12 AM

## 2015-11-14 NOTE — Discharge Summary (Signed)
Physician Discharge Summary  Alyssa [name] [id number] DOB: May 22, 1917 DOA: 11/11/2015  PCP: Florentina JennyRIPP, HENRY, MD  Admit date: 11/11/2015 Discharge date: 11/14/2015  Time spent: 45 minutes  Recommendations for Outpatient Follow-up:  [facility] with home health PT.  Patient will need to follow up with primary care provider within one week of discharge, repeat BMP.  Patient should continue medications as prescribed.  Patient should follow a heart healthy diet.   Discharge Diagnoses:  Dyspnea/bilateral pleural effusions Accelerated hypertension Depression  Discharge Condition: Stable  Diet recommendation: Heart healthy  Filed Weights   11/12/15 0400 11/13/15 1000 11/14/15 0549  Weight: 58.4 kg (128 lb 12 oz) 57.924 kg (127 lb 11.2 oz) 57.108 kg (125 lb 14.4 oz)    History of present illness:  on 11/12/2015 by Dr. Lyda PeroneJared Gardner Alyssa [name] is a 82 y.o. female with h/o HTN, patient presents to ED with 2 week history of mild SOB. Her NH sent her in after CXR there showed PNA vs CHF with pleural effusion. She has tried no meds for SOB. For HTN she is on multiple PO medications. In the ED it becomes very clear rapidly that patients SOB is due to acute CHF from hypertensive urgency. Her systolic BPs thus far have ben measured multiple times both by machine and manually to be between 224 and 252. She denies any chest pain, N/V/D, no abd pain, no fever, no wheezing.  Hospital Course:  Dyspnea/Bilateral Pleural Effusions -BNP upon admission 383.6 -Echocardiogram showed EF 55-60%, wall motion was normal, no regional wall motion abnormalities. He trimmed mildly dilated. Systolic pressure moderately increased in pulmonary arteries, pressure 63 -Patient does have LE edema as well as mild vascular congestion/small-mod pleural effusions on CXR. LE edema improving.  -No history of CHF -Continue supplemental oxygen to maintain sats  >92% -Placed on IV lasix, breathing improved -Will discharge with lasix -Monitor daily weights, intake/output -UOP over past 24 hours 1525cc -Patient will need to follow up with her PCP upon discharge and have repeat BMP -PT consulted and recommended home health.  Oxygen saturations on room air were 96% with ambulation.  Accelerated hypertension -BP better controlled -Will discontinue HCTZ -Continue inderal, losartan -Continue lasix   Depression -Continue zoloft  Procedures  Echocardiogram  Consults  None  Discharge Exam: Filed Vitals:   11/14/15 0549 11/14/15 0930  BP: 159/53 167/63  Pulse: 77 66  Temp: 98 F (36.7 C)   Resp: 16    Exam  General: Well developed, well nourished, NAD  HEENT: NCAT,mucous membranes moist.   Cardiovascular: S1 S2 auscultated, RRR  Respiratory: Diminished but clear, no wheezing  Abdomen: Soft, nontender, nondistended, + bowel sounds  Extremities: warm dry without cyanosis clubbing. +pedal edema, LE edema much improved  Neuro: AAOx3, nonfocal  Psych: Normal affect and demeanor, pleasant  Discharge Instructions      Discharge Instructions    Discharge instructions    Complete by:  As directed   [facility] with [facility] health PT.  Patient will need to follow up with primary care provider within one week of discharge, repeat BMP.  Patient should continue medications as prescribed.  Patient should follow a heart healthy diet.            Medication List    STOP taking these medications        hydrochlorothiazide 25 MG tablet  Commonly known as:  HYDRODIURIL      TAKE  these medications        acetaminophen 500 MG tablet  Commonly known as:  TYLENOL  Take 1,000 mg by mouth at bedtime. May also take twice daily as needed for pain     ergocalciferol 50000 units capsule  Commonly known as:  VITAMIN D2  Take 50,000 Units by mouth every 30 (thirty) days.     furosemide  20 MG tablet  Commonly known as:  LASIX  Take 1 tablet (20 mg total) by mouth daily.     guaiFENesin 100 MG/5ML liquid  Commonly known as:  ROBITUSSIN  Take 200 mg by mouth 2 (two) times daily as needed for cough.     hydrALAZINE 25 MG tablet  Commonly known as:  APRESOLINE  Take 25 mg by mouth 3 (three) times daily.     ipratropium 0.03 % nasal spray  Commonly known as:  ATROVENT  Place 2 sprays into both nostrils 2 (two) times daily.     IPRATROPIUM BROMIDE HFA IN  Inhale 2 puffs into the lungs 2 (two) times daily.     losartan 100 MG tablet  Commonly known as:  COZAAR  Take 100 mg by mouth daily.     meclizine 25 MG tablet  Commonly known as:  ANTIVERT  Take 25 mg by mouth 3 (three) times daily as needed for dizziness.     mirtazapine 30 MG tablet  Commonly known as:  REMERON  Take 37.5 mg by mouth at bedtime. Take along with 7.5 mg to equal 37.5 mg nightly     mirtazapine 7.5 MG tablet  Commonly known as:  REMERON  Take 37.5 mg by mouth at bedtime. Take along with 30 mg to equal 37.5 mg nightly     propranolol 20 MG tablet  Commonly known as:  INDERAL  Take 20 mg by mouth 3 (three) times daily.     senna 8.6 MG Tabs tablet  Commonly known as:  SENOKOT  Take 1 tablet by mouth every 12 (twelve) hours as needed for mild constipation.     sertraline 25 MG tablet  Commonly known as:  ZOLOFT  Take 37.5 mg by mouth daily. Takes 1 and 1/2 tablet     zolpidem 5 MG tablet  Commonly known as:  AMBIEN  Take 5 mg by mouth at bedtime as needed for sleep.       Allergies  Allergen Reactions  . Iron Other (See Comments)    *per mar*  . Voltaren [Diclofenac Sodium] Other (See Comments)    *per mar*   Follow-up Information    Follow up with Florentina Jenny, MD. Schedule an appointment as soon as possible for a visit in 1 week.   Specialty:  Family Medicine   Why:  Hospital follow up   Contact information:   [address] [place]  [address] [phone]        The results of significant diagnostics from this hospitalization (including imaging, microbiology, ancillary and laboratory) are listed below for reference.    Significant Diagnostic Studies: Dg Chest 2 View  11/12/2015  CLINICAL DATA:  82 year old female with shortness of breath for 2 weeks, worse today. EXAM: CHEST  2 VIEW COMPARISON:  04/06/2015 FINDINGS: Bilateral pleural effusions, moderate on left and small on the right. There are associated bibasilar opacities. Heart appears prominent size. Mild vascular congestion without overt edema. Calcified granuloma in the left lung with calcified left hilar nodes again seen. No pneumothorax. IMPRESSION: Bilateral pleural effusions, left  greater than right. Mild vascular congestion. Correlation for fluid overload recommended. Electronically Signed   By: Rubye Oaks M.D.   On: 11/12/2015 01:07    Microbiology: Recent Results (from the past 240 hour(s))  MRSA PCR Screening     Status: None   Collection Time: 11/12/15  4:58 AM  Result Value Ref Range Status   MRSA by PCR NEGATIVE NEGATIVE Final    Comment:        The GeneXpert MRSA Assay (FDA approved for NASAL specimens only), is one component of a comprehensive MRSA colonization surveillance program. It is not intended to diagnose MRSA infection nor to guide or monitor treatment for MRSA infections.      Labs: Basic Metabolic Panel:  Recent Labs Lab 11/12/15 0058 11/12/15 0550 11/13/15 0600 11/14/15 0500  NA 141 140 143 142  K 3.8 3.6 3.7 3.6  CL 99* 100* 101 101  CO2  --  29 32 32  GLUCOSE 122* 127* 101* 107*  BUN 22* 19 27* 36*  CREATININE 1.00 0.98 1.12* 1.19*  CALCIUM  --  9.6 9.2 9.1   Liver Function Tests: No results for input(s): AST, ALT, ALKPHOS, BILITOT, PROT, ALBUMIN in the last 168 hours. No results for input(s): LIPASE, AMYLASE in the last 168 hours. No results for input(s): AMMONIA in the last 168 hours. CBC:  Recent  Labs Lab 11/12/15 0042 11/12/15 0058 11/12/15 0550 11/13/15 0600 11/14/15 0500  WBC 8.3  --  8.1 5.6 5.5  NEUTROABS 6.2  --   --   --   --   HGB 12.8 13.9 12.9 11.7* 11.9*  HCT 39.9 41.0 39.1 37.4 37.5  MCV 93.4  --  89.5 94.2 93.8  PLT 167  --  159 182 180   Cardiac Enzymes: No results for input(s): CKTOTAL, CKMB, CKMBINDEX, TROPONINI in the last 168 hours. BNP: BNP (last 3 results)  Recent Labs  11/12/15 1105  BNP 383.6*    ProBNP (last 3 results) No results for input(s): PROBNP in the last 8760 hours.  CBG: No results for input(s): GLUCAP in the last 168 hours.     SignedEdsel Petrin  [facility] 11/14/2015, 1:57 PM

## 2015-11-14 NOTE — Progress Notes (Signed)
Patient is set to discharge back [facility] today. CSW confirmed with Gershon Cullriscilla that they are able to take patient back today. Patient & daughter at bedside aware. Discharge packet given to RN, Catie. Daughter to transport back to ALF.     Lincoln MaxinKelly Addysin Porco, LCSW [facility] Clinical Social Worker cell #: [phone]

## 2015-11-14 NOTE — NC FL2 (Addendum)
[place] MEDICAID FL2 LEVEL OF CARE SCREENING TOOL     IDENTIFICATION  Patient Name: Alyssa [name] Birthdate: Jun 29, 1917 Sex: female Admission Date (Current Location): 11/11/2015  [facility] and [place] Number:  Producer, television/film/video and Address:  [facility],  [address], [place] [address]      Provider Number: [id number]  Attending Physician Name and Address:  Edsel Petrin, DO  Relative Name and Phone Number:       Current Level of Care: Hospital Recommended Level of Care: Assisted Living Facility Prior Approval Number:    Date Approved/Denied:   PASRR Number:    Discharge Plan:  (ALF)    Current Diagnoses: Patient Active Problem List   Diagnosis Date Noted  . Hypertensive urgency 11/12/2015  . Pleural effusion   . Depression   . Dyspnea     Orientation RESPIRATION BLADDER Height & Weight     Self, Time, Situation, Place  Normal Continent Weight: 125 lb 14.4 oz (57.108 kg) Height:   (167.6 cm)  BEHAVIORAL SYMPTOMS/MOOD NEUROLOGICAL BOWEL NUTRITION STATUS      Continent Diet (Heart)  AMBULATORY STATUS COMMUNICATION OF NEEDS Skin   Limited Assist Verbally Normal                       Personal Care Assistance Level of Assistance  Bathing, Dressing Bathing Assistance: Limited assistance   Dressing Assistance: Limited assistance     Functional Limitations Info             SPECIAL CARE FACTORS FREQUENCY                       Contractures      Additional Factors Info  Code Status, Allergies Code Status Info: DNR Allergies Info: Iron, voltaren           Discharge Medications: Medication List    STOP taking these medications       hydrochlorothiazide 25 MG tablet  Commonly known as: HYDRODIURIL      TAKE these medications       acetaminophen 500 MG tablet  Commonly known as: TYLENOL  Take 1,000 mg by mouth at bedtime. May also take twice daily as needed for pain      ergocalciferol 50000 units capsule  Commonly known as: VITAMIN D2  Take 50,000 Units by mouth every 30 (thirty) days.     furosemide 20 MG tablet  Commonly known as: LASIX  Take 1 tablet (20 mg total) by mouth daily.     guaiFENesin 100 MG/5ML liquid  Commonly known as: ROBITUSSIN  Take 200 mg by mouth 2 (two) times daily as needed for cough.     hydrALAZINE 25 MG tablet  Commonly known as: APRESOLINE  Take 25 mg by mouth 3 (three) times daily.     ipratropium 0.03 % nasal spray  Commonly known as: ATROVENT  Place 2 sprays into both nostrils 2 (two) times daily.     IPRATROPIUM BROMIDE HFA IN  Inhale 2 puffs into the lungs 2 (two) times daily.     losartan 100 MG tablet  Commonly known as: COZAAR  Take 100 mg by mouth daily.     meclizine 25 MG tablet  Commonly known as: ANTIVERT  Take 25 mg by mouth 3 (three) times daily as needed for dizziness.     mirtazapine 30 MG tablet  Commonly known as: REMERON  Take 37.5 mg by mouth at bedtime. Take along  with 7.5 mg to equal 37.5 mg nightly     mirtazapine 7.5 MG tablet  Commonly known as: REMERON  Take 37.5 mg by mouth at bedtime. Take along with 30 mg to equal 37.5 mg nightly     propranolol 20 MG tablet  Commonly known as: INDERAL  Take 20 mg by mouth 3 (three) times daily.     senna 8.6 MG Tabs tablet  Commonly known as: SENOKOT  Take 1 tablet by mouth every 12 (twelve) hours as needed for mild constipation.     sertraline 25 MG tablet  Commonly known as: ZOLOFT  Take 37.5 mg by mouth daily. Takes 1 and 1/2 tablet     zolpidem 5 MG tablet  Commonly known as: AMBIEN  Take 5 mg by mouth at bedtime as needed for sleep.             Relevant Imaging Results:  Relevant Lab Results:   Additional Information SSN: [id number]   Home Health PT to evaluate & treat as needed  Arlyss RepressHarrison, Kraig Genis F, LCSW

## 2015-11-14 NOTE — Care Management Note (Signed)
Case Management Note  Patient Details  Name: Alyssa [name] MRN: [id number] Date of Birth: [birth year]  Subjective/Objective:    PT recc HHPT-[facility]-will fax d/c order,HHC order,[id number].                Action/Plan:d/c back to ALF   Expected Discharge Date:                 Expected Discharge Plan:  Home w Home Health Services  In-House Referral:     Discharge planning Services  CM Consult  Post Acute Care Choice:    Choice offered to:     DME Arranged:    DME Agency:     HH Arranged:  PT HH Agency:   International aid/development worker([facility])  Status of Service:  Completed, signed off  Medicare Important Message Given:    Date Medicare IM Given:    Medicare IM give by:    Date Additional Medicare IM Given:    Additional Medicare Important Message give by:     If discussed at Long Length of Stay Meetings, dates discussed:    Additional Comments:  Lanier ClamMahabir, Merrik [place]uebla, RN 11/14/2015, 2:19 PM

## 2015-11-30 ENCOUNTER — Other Ambulatory Visit (HOSPITAL_COMMUNITY): Payer: Self-pay | Admitting: Geriatric Medicine

## 2015-11-30 DIAGNOSIS — I10 Essential (primary) hypertension: Secondary | ICD-10-CM

## 2015-12-07 ENCOUNTER — Encounter (HOSPITAL_COMMUNITY): Payer: Medicare Other

## 2017-01-24 ENCOUNTER — Emergency Department (HOSPITAL_COMMUNITY): Payer: Medicare Other

## 2017-01-24 ENCOUNTER — Emergency Department (HOSPITAL_COMMUNITY)
Admission: EM | Admit: 2017-01-24 | Discharge: 2017-01-24 | Disposition: A | Discharge status: Skilled Nursing Facility | Payer: Medicare Other | Attending: Emergency Medicine | Admitting: Emergency Medicine

## 2017-01-24 DIAGNOSIS — Z79899 Other long term (current) drug therapy: Secondary | ICD-10-CM | POA: Diagnosis not present

## 2017-01-24 DIAGNOSIS — Y939 Activity, unspecified: Secondary | ICD-10-CM | POA: Diagnosis not present

## 2017-01-24 DIAGNOSIS — W01190A Fall on same level from slipping, tripping and stumbling with subsequent striking against furniture, initial encounter: Secondary | ICD-10-CM | POA: Diagnosis not present

## 2017-01-24 DIAGNOSIS — Y999 Unspecified external cause status: Secondary | ICD-10-CM | POA: Insufficient documentation

## 2017-01-24 DIAGNOSIS — I129 Hypertensive chronic kidney disease with stage 1 through stage 4 chronic kidney disease, or unspecified chronic kidney disease: Secondary | ICD-10-CM | POA: Diagnosis not present

## 2017-01-24 DIAGNOSIS — R030 Elevated blood-pressure reading, without diagnosis of hypertension: Secondary | ICD-10-CM

## 2017-01-24 DIAGNOSIS — Y92129 Unspecified place in nursing home as the place of occurrence of the external cause: Secondary | ICD-10-CM | POA: Diagnosis not present

## 2017-01-24 DIAGNOSIS — N183 Chronic kidney disease, stage 3 (moderate): Secondary | ICD-10-CM | POA: Diagnosis not present

## 2017-01-24 DIAGNOSIS — S52502A Unspecified fracture of the lower end of left radius, initial encounter for closed fracture: Secondary | ICD-10-CM

## 2017-01-24 DIAGNOSIS — Z87891 Personal history of nicotine dependence: Secondary | ICD-10-CM | POA: Insufficient documentation

## 2017-01-24 DIAGNOSIS — S0990XA Unspecified injury of head, initial encounter: Secondary | ICD-10-CM | POA: Diagnosis present

## 2017-01-24 DIAGNOSIS — S52122A Displaced fracture of head of left radius, initial encounter for closed fracture: Secondary | ICD-10-CM | POA: Insufficient documentation

## 2017-01-24 LAB — CBC WITH DIFFERENTIAL/PLATELET
BASOS PCT: 0 %
Basophils Absolute: 0 10*3/uL (ref 0.0–0.1)
EOS ABS: 0.3 10*3/uL (ref 0.0–0.7)
Eosinophils Relative: 3 %
HCT: 39.6 % (ref 36.0–46.0)
Hemoglobin: 13.2 g/dL (ref 12.0–15.0)
LYMPHS ABS: 1.7 10*3/uL (ref 0.7–4.0)
Lymphocytes Relative: 21 %
MCH: 29.9 pg (ref 26.0–34.0)
MCHC: 33.3 g/dL (ref 30.0–36.0)
MCV: 89.8 fL (ref 78.0–100.0)
MONO ABS: 0.8 10*3/uL (ref 0.1–1.0)
MONOS PCT: 10 %
Neutro Abs: 5.2 10*3/uL (ref 1.7–7.7)
Neutrophils Relative %: 66 %
Platelets: 162 10*3/uL (ref 150–400)
RBC: 4.41 MIL/uL (ref 3.87–5.11)
RDW: 13.4 % (ref 11.5–15.5)
WBC: 8 10*3/uL (ref 4.0–10.5)

## 2017-01-24 LAB — COMPREHENSIVE METABOLIC PANEL
ALBUMIN: 4 g/dL (ref 3.5–5.0)
ALT: 11 U/L — ABNORMAL LOW (ref 14–54)
ANION GAP: 10 (ref 5–15)
AST: 16 U/L (ref 15–41)
Alkaline Phosphatase: 53 U/L (ref 38–126)
BUN: 22 mg/dL — ABNORMAL HIGH (ref 6–20)
CO2: 32 mmol/L (ref 22–32)
Calcium: 9.7 mg/dL (ref 8.9–10.3)
Chloride: 97 mmol/L — ABNORMAL LOW (ref 101–111)
Creatinine, Ser: 1.28 mg/dL — ABNORMAL HIGH (ref 0.44–1.00)
GFR calc Af Amer: 39 mL/min — ABNORMAL LOW (ref >60)
GFR calc non Af Amer: 34 mL/min — ABNORMAL LOW (ref >60)
GLUCOSE: 118 mg/dL — AB (ref 65–99)
POTASSIUM: 3.8 mmol/L (ref 3.5–5.1)
SODIUM: 139 mmol/L (ref 135–145)
TOTAL PROTEIN: 7 g/dL (ref 6.5–8.1)
Total Bilirubin: 0.6 mg/dL (ref 0.3–1.2)

## 2017-01-24 LAB — URINALYSIS, ROUTINE W REFLEX MICROSCOPIC
BILIRUBIN URINE: NEGATIVE
Glucose, UA: NEGATIVE mg/dL
HGB URINE DIPSTICK: NEGATIVE
KETONES UR: NEGATIVE mg/dL
Leukocytes, UA: NEGATIVE
NITRITE: NEGATIVE
PH: 6 (ref 5.0–8.0)
Protein, ur: NEGATIVE mg/dL
Specific Gravity, Urine: 1.006 (ref 1.005–1.030)

## 2017-01-24 MED ORDER — HYDRALAZINE HCL 25 MG PO TABS
25.0000 mg | ORAL_TABLET | Freq: Once | ORAL | Status: AC
  Administered 2017-01-24: 25 mg via ORAL
  Filled 2017-01-24 (×2): qty 1

## 2017-01-24 MED ORDER — CLONIDINE HCL 0.1 MG PO TABS
0.2000 mg | ORAL_TABLET | Freq: Once | ORAL | Status: AC
  Administered 2017-01-24: 0.2 mg via ORAL
  Filled 2017-01-24: qty 2

## 2017-01-24 NOTE — ED Notes (Signed)
Bed: [id number] Expected date:  Expected time:  Means of arrival:  Comments: 83 yo F/ Fall-head injury

## 2017-01-24 NOTE — ED Notes (Signed)
Attempted to get EKG, but pt was in CT and X-ray.

## 2017-01-24 NOTE — ED Notes (Signed)
ED Provider at bedside. 

## 2017-01-24 NOTE — ED Provider Notes (Signed)
[facility] Provider Note   CSN: [id number] Arrival date & time: 01/24/17  6578     History   Chief Complaint Chief Complaint  Patient presents with  . Fall    HPI Alyssa [name] is a 83 y.o. female.  HPI Patient presents after a fall from standing. States she was ordered normal rehabilitation and she went to blow her nose. This caused her to lose her balance and she fell backwards striking her head on a piece of furniture. She had no loss of consciousness. Denies any visual changes or nausea. She complains of posterior scalp swelling and pain. States this improved since placing ice on the hematoma. She has chronic neck pain but she does not believe this is any worse. Denies any focal weakness or numbness. She does complain of mild left buttock pain after the fall. States she has not taken her evening dose of blood pressure medication. In that her blood pressure "always is high". Past Medical History:  Diagnosis Date  . Abdominal pain   . Abnormal gait   . Allergic rhinitis   . Altered mental status   . Bronchitis   . Candidal intertrigo   . Cataract   . Chronic GI hemorrhage   . Constipation   . Depression   . Diverticulosis   . Dizziness   . Dry eyes   . Dysphagia   . Edema   . Fatigue   . Foot drop   . Gait disturbance   . Gastritis   . GERD (gastroesophageal reflux disease)   . Hiatal hernia   . Hypercalcemia   . Hyperglycemia   . Hypertension   . Hypokalemia   . Hyponatremia   . Insomnia   . Intermittent claudication (HCC)   . Neck pain   . Neuropathy (HCC)   . Onychomycosis   . Osteoarthritis   . Pancreatitis   . Peptic ulcer disease with hemorrhage   . Rectal bleed   . Renal disorder    CKD stage 3  . Restless leg syndrome   . Shoulder pain   . Sinusitis   . Thrush   . Tremor   . Urinary frequency   . Vision impairment   . Vitamin D deficiency     Patient Active Problem List   Diagnosis Date Noted  . Hypertensive urgency  11/12/2015  . Pleural effusion   . Depression   . Dyspnea     Past Surgical History:  Procedure Laterality Date  . ABDOMINAL HYSTERECTOMY    . APPENDECTOMY    . CHOLECYSTECTOMY      OB History    No data available       Home Medications    Prior to Admission medications   Medication Sig Start Date End Date Taking? Authorizing Provider  acetaminophen (TYLENOL) 500 MG tablet Take 1,000 mg by mouth 2 (two) times daily.    Yes [provider]  fluticasone (FLONASE) 50 MCG/ACT nasal spray Place 2 sprays into both nostrils daily.   Yes [provider]  furosemide (LASIX) 20 MG tablet Take 20-30 mg by mouth daily. Pt takes one tablet on Sunday, Tuesday, Thursday, and Saturday. Pt takes one and one-half tablet on Monday, Wednesday, and Friday.   Yes [provider]  hydrALAZINE (APRESOLINE) 25 MG tablet Take 25 mg by mouth 3 (three) times daily.   Yes [provider]  levocetirizine (XYZAL) 5 MG tablet Take 5 mg by mouth at bedtime.   Yes [provider]  losartan (COZAAR) 50 MG tablet Take 50 mg by mouth daily.   Yes [provider]  meclizine (ANTIVERT) 25 MG tablet Take 25 mg by mouth every 8 (eight) hours as needed for dizziness.    Yes [provider]  Menthol, Topical Analgesic, (BIOFREEZE) 4 % GEL Apply 1 application topically every 8 (eight) hours as needed (for knee pain). Pt applies to left knee.   Yes [provider]  mirtazapine (REMERON) 30 MG tablet Take 30 mg by mouth at bedtime. Pt takes with a 7.5mg  tablet.   Yes [provider]  mirtazapine (REMERON) 7.5 MG tablet Take 7.5 mg by mouth at bedtime. Pt takes with a 30mg  tablet.   Yes [provider]  propranolol (INDERAL) 20 MG tablet Take 20 mg by mouth 3 (three) times daily.   Yes [provider]  senna (SENOKOT) 8.6 MG TABS tablet Take 1 tablet by mouth every 12 (twelve) hours as needed for mild constipation.   Yes [provider]  sertraline (ZOLOFT) 25 MG tablet Take 37.5 mg by mouth daily.    Yes [provider]  Vitamin D, Ergocalciferol, (DRISDOL) 50000 units CAPS capsule Take 50,000 Units by mouth every 30 (thirty) days. Pt takes on the 11th of every month.   Yes [provider]  zolpidem (AMBIEN) 5 MG tablet Take 2.5 mg by mouth at bedtime.    Yes [provider]    Family History No family history on file.  Social History Social History  Substance Use Topics  . Smoking status: Former Smoker    Quit date: 11/12/1950  . Smokeless tobacco: Not on file  . Alcohol use No     Allergies   Iron and Voltaren [diclofenac sodium]   Review of Systems Review of Systems  Constitutional: Negative for chills, fatigue and fever.  HENT: Negative for facial swelling.   Eyes: Negative for visual disturbance.  Respiratory: Negative for cough, chest tightness and shortness of breath.   Cardiovascular: Negative for chest pain, palpitations and leg swelling.  Gastrointestinal: Negative for abdominal pain, constipation, diarrhea, nausea and vomiting.  Genitourinary: Negative for flank pain, frequency, hematuria and pelvic pain.  Musculoskeletal: Positive for arthralgias and neck pain. Negative for myalgias and neck stiffness.  Skin: Positive for wound. Negative for rash.  Neurological: Negative for dizziness, syncope, weakness, light-headedness, numbness and headaches.  Psychiatric/Behavioral: Negative for confusion.     Physical Exam Updated Vital Signs BP 128/62 (BP Location: Right Arm)   Pulse (!) 58   Temp 97.7 F (36.5 C) (Oral)   Resp 17   SpO2 94%   Physical Exam  Constitutional: She is oriented to person, place, and time. She appears well-developed and well-nourished.  HENT:  Head: Normocephalic.  Mouth/Throat: Oropharynx is clear and moist.  Patient with left posterior scalp hematoma. No underlying bony deformity.   Eyes: EOM are normal. Pupils are equal,  round, and reactive to light.  Neck: Normal range of motion. Neck supple.  Diffuse mild midline cervical tenderness to palpation. There are no step-offs or deformities.  Cardiovascular: Normal rate and regular rhythm.  Exam reveals no gallop and no friction rub.   No murmur heard. Pulmonary/Chest: Effort normal and breath sounds normal. No respiratory distress. She has no wheezes. She has no rales. She exhibits no tenderness.  Abdominal: Soft. Bowel sounds are normal. There is no tenderness. There is no rebound and no guarding.  Musculoskeletal: Normal range of motion. She exhibits tenderness. She exhibits no edema.  Patient has some mild left buttock tenderness to palpation. She also has left wrist swelling and ecchymosis. No midline thoracic or lumbar tenderness. Pelvis is stable. Normal range of motion of both hips.  Neurological: She is alert and oriented to person, place, and time.  Patient is alert and oriented x3 with clear, goal oriented speech. Patient has 5/5 motor in all extremities. Sensation is intact to light touch.  Skin: Skin is warm and dry. Capillary refill takes less than 2 seconds. No rash noted. No erythema.  Psychiatric: She has a normal mood and affect. Her behavior is normal.  Nursing note and vitals reviewed.    ED Treatments / Results  Labs (all labs ordered are listed, but only abnormal results are displayed) Labs Reviewed  COMPREHENSIVE METABOLIC PANEL - Abnormal; Notable for the following:       Result Value   Chloride 97 (*)    Glucose, Bld 118 (*)    BUN 22 (*)    Creatinine, Ser 1.28 (*)    ALT 11 (*)    GFR calc non Af Amer 34 (*)    GFR calc Af Amer 39 (*)    All other components within normal limits  CBC WITH DIFFERENTIAL/PLATELET  URINALYSIS, ROUTINE W REFLEX MICROSCOPIC    EKG  EKG Interpretation  Date/Time:  Thursday Jan 24 2017 20:00:06 EDT Ventricular Rate:  55 PR Interval:    QRS Duration: 108 QT Interval:  459 QTC  Calculation: 439 R Axis:   -14 Text Interpretation:  Sinus or ectopic atrial rhythm Incomplete RBBB and LAFB Anterior infarct, old Abnormal T, consider ischemia, lateral leads , new since last tracing Confirmed by Linwood Dibbles [id number]) on 01/25/2017 1:03:57 PM       Radiology Dg Wrist Complete Left  Result Date: 01/24/2017 CLINICAL DATA:  Fall with bruising EXAM: LEFT WRIST - COMPLETE 3+ VIEW COMPARISON:  None. FINDINGS: Osteopenia limits the exam. Suspect subtle lucency in the distal radius extending to the articular surface. No dislocation. Soft tissue protuberance over the distal ulna. IMPRESSION: Limited study due to osteopenia. Suspect nondisplaced intra-articular distal radius fracture Electronically Signed   By: Jasmine Pang M.D.   On: 01/24/2017 19:56   Ct Head Wo Contrast  Result Date: 01/24/2017 CLINICAL DATA:  83 year old female unwitnessed fall. Scalp hematoma. EXAM: CT HEAD WITHOUT CONTRAST CT CERVICAL SPINE WITHOUT CONTRAST TECHNIQUE: Multidetector CT imaging of the head and cervical spine was performed following the standard protocol without intravenous contrast. Multiplanar CT image reconstructions of the cervical spine were also generated. COMPARISON:  Head CT without contrast 04/06/2015. FINDINGS: CT HEAD FINDINGS Brain: Stable cerebral volume since 2016. Mild dystrophic calcifications at the basal ganglia again noted. Similar mild dystrophic calcifications in the deep left cerebellar nuclei. Stable gray-white matter differentiation throughout the brain. No cortically based acute infarct identified. No acute intracranial hemorrhage identified. No midline shift, mass effect, or evidence of intracranial mass lesion. No ventriculomegaly. Vascular: Calcified atherosclerosis at the skull base. Skull: Stable and intact. Sinuses/Orbits: Visualized paranasal sinuses and mastoids are stable and well pneumatized. Other: Broad-based left posterosuperior convexity scalp hematoma measures up to 14 mm  in thickness. Underlying calvarium intact. Other visible scalp and orbits soft tissues appear normal. CT CERVICAL SPINE FINDINGS Alignment: Straightening of cervical lordosis. Trace degenerative appearing anterolisthesis of C7 on T1. Similar anterolisthesis of T1 on T2. Skull base and vertebrae: Visualized skull base is intact. No atlanto-occipital dissociation. No cervical spine fracture identified. Soft tissues and spinal canal: No prevertebral fluid or  swelling. No visible canal hematoma. Calcified atherosclerosis in the neck. Disc levels: Severe occipital condyles -C1 and C1-C2 joint space loss. Visualized skull base is intact. No atlanto-occipital dissociation. Bulky degenerative ligamentous hypertrophy about the odontoid. Bilateral C2 to C4 posterior element ankylosis. Severe C4-C5 and C5-C6 disc space loss and endplate spurring. Widespread bulky facet hypertrophy. Mild degenerative spinal stenosis at the cervicomedullary junction in large part due to the ligamentous hypertrophy. Mild degenerative spinal stenosis suspected at C5-C6 in part due to ligament flavum hypertrophy. Upper chest: Visible upper thoracic levels appear intact. Biapical lung scarring. Calcified aortic atherosclerosis. Negative visualized superior mediastinum. IMPRESSION: 1. Scalp hematoma without underlying fracture. 2.  No acute intracranial abnormality. 3.  No acute fracture or listhesis identified in the cervical spine. 4. Severe chronic degenerative changes from the skullbase to the lower cervical spine. Degenerative spinal stenosis at the cervicomedullary junction and at C5-C6. Electronically Signed   By: H  Hall M.D.   Odessa Flemingn: 01/24/2017 20:15   Ct Cervical Spine Wo Contrast  Result Date: 01/24/2017 CLINICAL DATA:  83 year old female unwitnessed fall. Scalp hematoma. EXAM: CT HEAD WITHOUT CONTRAST CT CERVICAL SPINE WITHOUT CONTRAST TECHNIQUE: Multidetector CT imaging of the head and cervical spine was performed following the standard  protocol without intravenous contrast. Multiplanar CT image reconstructions of the cervical spine were also generated. COMPARISON:  Head CT without contrast 04/06/2015. FINDINGS: CT HEAD FINDINGS Brain: Stable cerebral volume since 2016. Mild dystrophic calcifications at the basal ganglia again noted. Similar mild dystrophic calcifications in the deep left cerebellar nuclei. Stable gray-white matter differentiation throughout the brain. No cortically based acute infarct identified. No acute intracranial hemorrhage identified. No midline shift, mass effect, or evidence of intracranial mass lesion. No ventriculomegaly. Vascular: Calcified atherosclerosis at the skull base. Skull: Stable and intact. Sinuses/Orbits: Visualized paranasal sinuses and mastoids are stable and well pneumatized. Other: Broad-based left posterosuperior convexity scalp hematoma measures up to 14 mm in thickness. Underlying calvarium intact. Other visible scalp and orbits soft tissues appear normal. CT CERVICAL SPINE FINDINGS Alignment: Straightening of cervical lordosis. Trace degenerative appearing anterolisthesis of C7 on T1. Similar anterolisthesis of T1 on T2. Skull base and vertebrae: Visualized skull base is intact. No atlanto-occipital dissociation. No cervical spine fracture identified. Soft tissues and spinal canal: No prevertebral fluid or swelling. No visible canal hematoma. Calcified atherosclerosis in the neck. Disc levels: Severe occipital condyles -C1 and C1-C2 joint space loss. Visualized skull base is intact. No atlanto-occipital dissociation. Bulky degenerative ligamentous hypertrophy about the odontoid. Bilateral C2 to C4 posterior element ankylosis. Severe C4-C5 and C5-C6 disc space loss and endplate spurring. Widespread bulky facet hypertrophy. Mild degenerative spinal stenosis at the cervicomedullary junction in large part due to the ligamentous hypertrophy. Mild degenerative spinal stenosis suspected at C5-C6 in part due  to ligament flavum hypertrophy. Upper chest: Visible upper thoracic levels appear intact. Biapical lung scarring. Calcified aortic atherosclerosis. Negative visualized superior mediastinum. IMPRESSION: 1. Scalp hematoma without underlying fracture. 2.  No acute intracranial abnormality. 3.  No acute fracture or listhesis identified in the cervical spine. 4. Severe chronic degenerative changes from the skullbase to the lower cervical spine. Degenerative spinal stenosis at the cervicomedullary junction and at C5-C6. Electronically Signed   By: Odessa FlemingH  Hall M.D.   On: 01/24/2017 20:15   Dg Hip Unilat W Or Wo Pelvis 2-3 Views Left  Result Date: 01/24/2017 CLINICAL DATA:  Unwitnessed fall EXAM: DG HIP (WITH OR WITHOUT PELVIS) 2-3V LEFT COMPARISON:  None. FINDINGS: SI joints are symmetric. Status post  right hip replacement with no dislocation. Pubic symphysis is intact. No acute fracture or malalignment. Mild arthritis of left hip. Multiple calcified pelvic phleboliths. Vascular calcifications. IMPRESSION: Status post right hip replacement. No acute osseous abnormality of left hip. Electronically Signed   By: Jasmine Pang M.D.   On: 01/24/2017 19:54    Procedures Procedures (including critical care time)  Medications Ordered in ED Medications  hydrALAZINE (APRESOLINE) tablet 25 mg (25 mg Oral Given 01/24/17 2106)  cloNIDine (CATAPRES) tablet 0.2 mg (0.2 mg Oral Given 01/24/17 2100)     Initial Impression / Assessment and Plan / ED Course  I have reviewed the triage vital signs and the nursing notes.  Pertinent labs & imaging results that were available during my care of the patient were reviewed by me and considered in my medical decision making (see chart for details).    Blood pressures improved. Questionable fracture to the left wrist. Placed in a Velcro splint. Patient was significantly elevated blood pressure in the emergency department. Improved with medication. Appears to be asymptomatic from this.  Advised close follow up with her primary physician and also given referral to hand surgery. Return precautions given.   Final Clinical Impressions(s) / ED Diagnoses   Final diagnoses:  Closed head injury, initial encounter  Closed fracture of distal end of left radius, unspecified fracture morphology, initial encounter  Transient elevated blood pressure    New Prescriptions Discharge Medication List as of 01/24/2017 10:31 PM       Loren Racer, MD 01/25/17 2304

## 2017-01-24 NOTE — ED Notes (Signed)
Patient transported to X-ray 

## 2017-01-24 NOTE — ED Triage Notes (Signed)
[address] [place] [facility] [place] Pt had a unwitnessed fall hit the head on corner of wall per pt. Denies blood thinners, Denies neck or back pain. Hematoma noted to back head, no bleeding at this time. Pt AOx4.

## 2017-03-03 ENCOUNTER — Encounter (HOSPITAL_COMMUNITY): Payer: Self-pay | Admitting: Emergency Medicine

## 2017-03-03 ENCOUNTER — Inpatient Hospital Stay (HOSPITAL_COMMUNITY)
Admission: EM | Admit: 2017-03-03 | Discharge: 2017-04-03 | DRG: 304 | Disposition: E | Payer: Medicare Other | Attending: Internal Medicine | Admitting: Internal Medicine

## 2017-03-03 ENCOUNTER — Emergency Department (HOSPITAL_COMMUNITY): Payer: Medicare Other

## 2017-03-03 DIAGNOSIS — R54 Age-related physical debility: Secondary | ICD-10-CM | POA: Diagnosis present

## 2017-03-03 DIAGNOSIS — G92 Toxic encephalopathy: Secondary | ICD-10-CM | POA: Diagnosis present

## 2017-03-03 DIAGNOSIS — I5031 Acute diastolic (congestive) heart failure: Secondary | ICD-10-CM | POA: Diagnosis present

## 2017-03-03 DIAGNOSIS — R0902 Hypoxemia: Secondary | ICD-10-CM | POA: Diagnosis not present

## 2017-03-03 DIAGNOSIS — Z90710 Acquired absence of both cervix and uterus: Secondary | ICD-10-CM

## 2017-03-03 DIAGNOSIS — Z87891 Personal history of nicotine dependence: Secondary | ICD-10-CM

## 2017-03-03 DIAGNOSIS — R1314 Dysphagia, pharyngoesophageal phase: Secondary | ICD-10-CM | POA: Diagnosis present

## 2017-03-03 DIAGNOSIS — I130 Hypertensive heart and chronic kidney disease with heart failure and stage 1 through stage 4 chronic kidney disease, or unspecified chronic kidney disease: Secondary | ICD-10-CM | POA: Diagnosis present

## 2017-03-03 DIAGNOSIS — I160 Hypertensive urgency: Principal | ICD-10-CM | POA: Diagnosis present

## 2017-03-03 DIAGNOSIS — R1319 Other dysphagia: Secondary | ICD-10-CM

## 2017-03-03 DIAGNOSIS — R0602 Shortness of breath: Secondary | ICD-10-CM

## 2017-03-03 DIAGNOSIS — Z888 Allergy status to other drugs, medicaments and biological substances status: Secondary | ICD-10-CM

## 2017-03-03 DIAGNOSIS — Z79899 Other long term (current) drug therapy: Secondary | ICD-10-CM

## 2017-03-03 DIAGNOSIS — Z66 Do not resuscitate: Secondary | ICD-10-CM | POA: Diagnosis present

## 2017-03-03 DIAGNOSIS — R1310 Dysphagia, unspecified: Secondary | ICD-10-CM

## 2017-03-03 DIAGNOSIS — E871 Hypo-osmolality and hyponatremia: Secondary | ICD-10-CM | POA: Diagnosis present

## 2017-03-03 DIAGNOSIS — G929 Unspecified toxic encephalopathy: Secondary | ICD-10-CM

## 2017-03-03 DIAGNOSIS — Z515 Encounter for palliative care: Secondary | ICD-10-CM

## 2017-03-03 DIAGNOSIS — Z7951 Long term (current) use of inhaled steroids: Secondary | ICD-10-CM

## 2017-03-03 DIAGNOSIS — R531 Weakness: Secondary | ICD-10-CM

## 2017-03-03 DIAGNOSIS — N183 Chronic kidney disease, stage 3 unspecified: Secondary | ICD-10-CM

## 2017-03-03 DIAGNOSIS — K219 Gastro-esophageal reflux disease without esophagitis: Secondary | ICD-10-CM | POA: Diagnosis present

## 2017-03-03 DIAGNOSIS — R1110 Vomiting, unspecified: Secondary | ICD-10-CM | POA: Diagnosis present

## 2017-03-03 DIAGNOSIS — J9602 Acute respiratory failure with hypercapnia: Secondary | ICD-10-CM | POA: Diagnosis not present

## 2017-03-03 DIAGNOSIS — I480 Paroxysmal atrial fibrillation: Secondary | ICD-10-CM | POA: Diagnosis present

## 2017-03-03 DIAGNOSIS — J690 Pneumonitis due to inhalation of food and vomit: Secondary | ICD-10-CM | POA: Diagnosis not present

## 2017-03-03 DIAGNOSIS — R05 Cough: Secondary | ICD-10-CM

## 2017-03-03 DIAGNOSIS — R059 Cough, unspecified: Secondary | ICD-10-CM

## 2017-03-03 DIAGNOSIS — E861 Hypovolemia: Secondary | ICD-10-CM | POA: Diagnosis present

## 2017-03-03 DIAGNOSIS — N179 Acute kidney failure, unspecified: Secondary | ICD-10-CM | POA: Diagnosis not present

## 2017-03-03 DIAGNOSIS — R627 Adult failure to thrive: Secondary | ICD-10-CM | POA: Diagnosis present

## 2017-03-03 DIAGNOSIS — I248 Other forms of acute ischemic heart disease: Secondary | ICD-10-CM | POA: Diagnosis present

## 2017-03-03 LAB — CBC
HCT: 38.5 % (ref 36.0–46.0)
HEMOGLOBIN: 12.9 g/dL (ref 12.0–15.0)
MCH: 30.2 pg (ref 26.0–34.0)
MCHC: 33.5 g/dL (ref 30.0–36.0)
MCV: 90.2 fL (ref 78.0–100.0)
PLATELETS: 152 10*3/uL (ref 150–400)
RBC: 4.27 MIL/uL (ref 3.87–5.11)
RDW: 13.6 % (ref 11.5–15.5)
WBC: 10.4 10*3/uL (ref 4.0–10.5)

## 2017-03-03 LAB — COMPREHENSIVE METABOLIC PANEL
ALBUMIN: 3.7 g/dL (ref 3.5–5.0)
ALK PHOS: 89 U/L (ref 38–126)
ALT: 45 U/L (ref 14–54)
ANION GAP: 13 (ref 5–15)
AST: 56 U/L — ABNORMAL HIGH (ref 15–41)
BUN: 23 mg/dL — ABNORMAL HIGH (ref 6–20)
CALCIUM: 9.3 mg/dL (ref 8.9–10.3)
CHLORIDE: 88 mmol/L — AB (ref 101–111)
CO2: 26 mmol/L (ref 22–32)
Creatinine, Ser: 1.24 mg/dL — ABNORMAL HIGH (ref 0.44–1.00)
GFR calc Af Amer: 41 mL/min — ABNORMAL LOW (ref >60)
GFR calc non Af Amer: 35 mL/min — ABNORMAL LOW (ref >60)
GLUCOSE: 146 mg/dL — AB (ref 65–99)
Potassium: 4.5 mmol/L (ref 3.5–5.1)
SODIUM: 127 mmol/L — AB (ref 135–145)
Total Bilirubin: 1.7 mg/dL — ABNORMAL HIGH (ref 0.3–1.2)
Total Protein: 7.6 g/dL (ref 6.5–8.1)

## 2017-03-03 LAB — BRAIN NATRIURETIC PEPTIDE: B NATRIURETIC PEPTIDE 5: 1375.3 pg/mL — AB (ref 0.0–100.0)

## 2017-03-03 LAB — TROPONIN I: Troponin I: 0.06 ng/mL (ref <0.03)

## 2017-03-03 MED ORDER — ONDANSETRON HCL 4 MG PO TABS: 4.0000 mg | ORAL_TABLET | Freq: Four times a day (QID) | ORAL | Status: DC | PRN

## 2017-03-03 MED ORDER — ENOXAPARIN SODIUM 30 MG/0.3ML ~~LOC~~ SOLN
30.0000 mg | SUBCUTANEOUS | Status: DC
  Administered 2017-03-04 – 2017-03-08 (×4): 30 mg via SUBCUTANEOUS
  Filled 2017-03-03 (×4): qty 0.3

## 2017-03-03 MED ORDER — LOSARTAN POTASSIUM 50 MG PO TABS
50.0000 mg | ORAL_TABLET | Freq: Every day | ORAL | Status: DC
  Administered 2017-03-05: 50 mg via ORAL
  Filled 2017-03-03 (×2): qty 1

## 2017-03-03 MED ORDER — MIRTAZAPINE 30 MG PO TABS
30.0000 mg | ORAL_TABLET | Freq: Every day | ORAL | Status: DC
  Administered 2017-03-04 – 2017-03-07 (×4): 30 mg via ORAL
  Filled 2017-03-03 (×4): qty 1

## 2017-03-03 MED ORDER — FUROSEMIDE 10 MG/ML IJ SOLN
40.0000 mg | Freq: Once | INTRAMUSCULAR | Status: AC
  Administered 2017-03-03: 40 mg via INTRAVENOUS

## 2017-03-03 MED ORDER — FUROSEMIDE 20 MG PO TABS
20.0000 mg | ORAL_TABLET | Freq: Every day | ORAL | Status: DC
  Filled 2017-03-03: qty 1

## 2017-03-03 MED ORDER — HYDRALAZINE HCL 20 MG/ML IJ SOLN
10.0000 mg | Freq: Once | INTRAMUSCULAR | Status: DC
  Filled 2017-03-03: qty 1

## 2017-03-03 MED ORDER — HYDRALAZINE HCL 25 MG PO TABS
25.0000 mg | ORAL_TABLET | Freq: Three times a day (TID) | ORAL | Status: DC
  Administered 2017-03-04 – 2017-03-06 (×5): 25 mg via ORAL
  Filled 2017-03-03 (×6): qty 1

## 2017-03-03 MED ORDER — FUROSEMIDE 10 MG/ML IJ SOLN
60.0000 mg | Freq: Once | INTRAMUSCULAR | Status: DC
  Filled 2017-03-03: qty 6

## 2017-03-03 MED ORDER — HYDRALAZINE HCL 20 MG/ML IJ SOLN: 10.0000 mg | INTRAMUSCULAR | Status: DC | PRN

## 2017-03-03 MED ORDER — ONDANSETRON HCL 4 MG/2ML IJ SOLN: 4.0000 mg | Freq: Four times a day (QID) | INTRAMUSCULAR | Status: DC | PRN

## 2017-03-03 MED ORDER — LABETALOL HCL 5 MG/ML IV SOLN
10.0000 mg | Freq: Once | INTRAVENOUS | Status: DC
  Filled 2017-03-03: qty 4

## 2017-03-03 MED ORDER — SODIUM CHLORIDE 0.9% FLUSH
3.0000 mL | Freq: Two times a day (BID) | INTRAVENOUS | Status: DC
  Administered 2017-03-04 – 2017-03-07 (×9): 3 mL via INTRAVENOUS

## 2017-03-03 MED ORDER — MUSCLE RUB 10-15 % EX CREA: 1.0000 "application " | TOPICAL_CREAM | Freq: Three times a day (TID) | CUTANEOUS | Status: DC | PRN

## 2017-03-03 MED ORDER — ASPIRIN 325 MG PO TABS
325.0000 mg | ORAL_TABLET | Freq: Once | ORAL | Status: AC
  Administered 2017-03-03: 325 mg via ORAL
  Filled 2017-03-03: qty 1

## 2017-03-03 MED ORDER — ALBUTEROL SULFATE (2.5 MG/3ML) 0.083% IN NEBU
5.0000 mg | INHALATION_SOLUTION | Freq: Once | RESPIRATORY_TRACT | Status: AC
  Administered 2017-03-03: 5 mg via RESPIRATORY_TRACT
  Filled 2017-03-03: qty 6

## 2017-03-03 MED ORDER — FLUTICASONE PROPIONATE 50 MCG/ACT NA SUSP
2.0000 | Freq: Every day | NASAL | Status: DC
  Filled 2017-03-03: qty 16

## 2017-03-03 MED ORDER — NITROGLYCERIN 2 % TD OINT
0.5000 [in_us] | TOPICAL_OINTMENT | Freq: Once | TRANSDERMAL | Status: DC
  Filled 2017-03-03: qty 1

## 2017-03-03 MED ORDER — HYDRALAZINE HCL 20 MG/ML IJ SOLN: 5.0000 mg | INTRAMUSCULAR | Status: DC | PRN

## 2017-03-03 MED ORDER — ACETAMINOPHEN 500 MG PO TABS
1000.0000 mg | ORAL_TABLET | Freq: Two times a day (BID) | ORAL | Status: DC
  Administered 2017-03-03 – 2017-03-07 (×7): 1000 mg via ORAL
  Filled 2017-03-03 (×10): qty 2

## 2017-03-03 MED ORDER — PROPRANOLOL HCL 20 MG PO TABS
20.0000 mg | ORAL_TABLET | Freq: Three times a day (TID) | ORAL | Status: DC
  Administered 2017-03-03: 20 mg via ORAL
  Filled 2017-03-03 (×3): qty 1

## 2017-03-03 MED ORDER — SENNA 8.6 MG PO TABS: 1.0000 | ORAL_TABLET | Freq: Two times a day (BID) | ORAL | Status: DC | PRN

## 2017-03-03 MED ORDER — MECLIZINE HCL 25 MG PO TABS
25.0000 mg | ORAL_TABLET | Freq: Three times a day (TID) | ORAL | Status: DC | PRN
  Filled 2017-03-03: qty 1

## 2017-03-03 MED ORDER — SERTRALINE HCL 25 MG PO TABS
25.0000 mg | ORAL_TABLET | Freq: Every day | ORAL | Status: DC
  Filled 2017-03-03: qty 1

## 2017-03-03 MED ORDER — ZOLPIDEM TARTRATE 5 MG PO TABS
2.5000 mg | ORAL_TABLET | Freq: Every day | ORAL | Status: DC
  Administered 2017-03-04: 2.5 mg via ORAL
  Filled 2017-03-03: qty 1

## 2017-03-03 MED ORDER — LORATADINE 10 MG PO TABS
10.0000 mg | ORAL_TABLET | Freq: Every day | ORAL | Status: DC
  Administered 2017-03-03 – 2017-03-07 (×4): 10 mg via ORAL
  Filled 2017-03-03 (×7): qty 1

## 2017-03-03 MED ORDER — HYDRALAZINE HCL 20 MG/ML IJ SOLN
10.0000 mg | Freq: Once | INTRAMUSCULAR | Status: AC
  Administered 2017-03-03: 10 mg via INTRAVENOUS
  Filled 2017-03-03: qty 1

## 2017-03-03 NOTE — H&P (Signed)
History and Physical    Alyssa [name] ZOX:[id number] DOB: 1916/10/19 DOA: 03/05/2017  PCP: Florentina Jennyripp, [place], MD  [facility]  I have personally briefly reviewed patient's [facility]  Chief Complaint: SOB  HPI: Alyssa [name] is a 83 y.o. female with medical history significant of Accelerated HTN and prior admit for HTN urgency back in March of 2017.  Echo during that admission showed normal EF.  BPs were ~225 systolic in the ED.  Patient was treated with 20mg  IV hydralazine, home BPs continued, and she was given 40mg  IV lasix.  Today she presents to the ED with c/o SOB and worsening peripheral edema.  Symptoms onset 3 days ago, worsening since onset.  She has been being given her home meds by the ALF.     ED Course: BP is 210/114 today.  All BP readings in ED have continued to be high running ~200 systolic over 110s-120s.   Review of Systems: As per HPI otherwise 10 point review of systems negative.   Past Medical History:  Diagnosis Date  . Abdominal pain   . Abnormal gait   . Allergic rhinitis   . Altered mental status   . Bronchitis   . Candidal intertrigo   . Cataract   . Chronic GI hemorrhage   . Constipation   . Depression   . Diverticulosis   . Dizziness   . Dry eyes   . Dysphagia   . Edema   . Fatigue   . Foot drop   . Gait disturbance   . Gastritis   . GERD (gastroesophageal reflux disease)   . Hiatal hernia   . Hypercalcemia   . Hyperglycemia   . Hypertension   . Hypokalemia   . Hyponatremia   . Insomnia   . Intermittent claudication (HCC)   . Neck pain   . Neuropathy   . Onychomycosis   . Osteoarthritis   . Pancreatitis   . Peptic ulcer disease with hemorrhage   . Rectal bleed   . Renal disorder    CKD stage 3  . Restless leg syndrome   . Shoulder pain   . Sinusitis   . Thrush   . Tremor   . Urinary frequency   . Vision impairment   . Vitamin D deficiency     Past  Surgical History:  Procedure Laterality Date  . ABDOMINAL HYSTERECTOMY    . APPENDECTOMY    . CHOLECYSTECTOMY       reports that she quit smoking about 66 years ago. She does not have any smokeless tobacco history on file. She reports that she does not drink alcohol. Her drug history is not on file.  Allergies  Allergen Reactions  . Iron Other (See Comments)    Reaction:  Unknown   . Voltaren [Diclofenac Sodium] Other (See Comments)    Reaction:  Unknown     No family history on file.   Prior to Admission medications   Medication Sig Start Date End Date Taking? Authorizing Provider  acetaminophen (TYLENOL) 500 MG tablet Take 1,000 mg by mouth 2 (two) times daily.    Yes [provider]  fluticasone (FLONASE) 50 MCG/ACT nasal spray Place 2 sprays into both nostrils daily.   Yes [provider]  furosemide (LASIX) 20 MG tablet Take 20 mg by mouth daily.    Yes [provider]  hydrALAZINE (APRESOLINE) 25 MG tablet Take 25 mg by mouth 3 (three) times  daily.   Yes [provider]  levocetirizine (XYZAL) 5 MG tablet Take 5 mg by mouth at bedtime.   Yes [provider]  losartan (COZAAR) 50 MG tablet Take 50 mg by mouth daily.   Yes [provider]  meclizine (ANTIVERT) 25 MG tablet Take 25 mg by mouth every 8 (eight) hours as needed for dizziness.    Yes [provider]  mirtazapine (REMERON) 30 MG tablet Take 30 mg by mouth at bedtime. Pt takes with a 7.5mg  tablet.   Yes [provider]  propranolol (INDERAL) 20 MG tablet Take 20 mg by mouth 3 (three) times daily.   Yes [provider]  sertraline (ZOLOFT) 25 MG tablet Take 25 mg by mouth daily.    Yes [provider]  Vitamin D, Ergocalciferol, (DRISDOL) 50000 units CAPS capsule Take 50,000 Units by mouth every 30 (thirty) days. Pt takes on the 11th of every month.   Yes [provider]  zolpidem (AMBIEN) 5 MG tablet Take 2.5 mg by mouth  at bedtime.    Yes [provider]  Menthol, Topical Analgesic, (BIOFREEZE) 4 % GEL Apply 1 application topically every 8 (eight) hours as needed (for knee pain). Pt applies to left knee.    [provider]  senna (SENOKOT) 8.6 MG TABS tablet Take 1 tablet by mouth every 12 (twelve) hours as needed for mild constipation.    [provider]    Physical Exam: Vitals:   08-Mar-2017 2245 03/13/2017 2300 March 08, 2017 2302 03/21/2017 2305  BP: (!) 201/116 (!) 216/126 (!) 216/126 (!) 200/114  Pulse: 96 98  100  Resp: (!) 24 19  19   Temp:      TempSrc:      SpO2: 96% 96%  95%    Constitutional: NAD, calm, comfortable Eyes: PERRL, lids and conjunctivae normal ENMT: Mucous membranes are moist. Posterior pharynx clear of any exudate or lesions.Normal dentition. Hard of hearing. Neck: normal, supple, no masses, no thyromegaly Respiratory: clear to auscultation bilaterally, no wheezing, no crackles. Normal respiratory effort. No accessory muscle use.  Cardiovascular: Regular rate and rhythm, no murmurs / rubs / gallops. No extremity edema. 2+ pedal pulses. No carotid bruits.  Abdomen: no tenderness, no masses palpated. No hepatosplenomegaly. Bowel sounds positive.  Musculoskeletal: no clubbing / cyanosis. No joint deformity upper and lower extremities. Good ROM, no contractures. Normal muscle tone.  Skin: no rashes, lesions, ulcers. No induration Neurologic: CN 2-12 grossly intact. Sensation intact, DTR normal. Strength 5/5 in all 4.  Psychiatric: Normal judgment and insight. Alert and oriented x 3. Normal mood.    Labs on Admission: I have personally reviewed following labs and imaging studies  CBC:  Recent Labs Lab March 08, 2017 2058  WBC 10.4  HGB 12.9  HCT 38.5  MCV 90.2  PLT 152   Basic Metabolic Panel:  Recent Labs Lab 03/12/2017 2058  NA 127*  K 4.5  CL 88*  CO2 26  GLUCOSE 146*  BUN 23*  CREATININE 1.24*  CALCIUM 9.3   GFR: CrCl cannot be calculated  (Unknown ideal weight.). Liver Function Tests:  Recent Labs Lab 03/09/2017 2058  AST 56*  ALT 45  ALKPHOS 89  BILITOT 1.7*  PROT 7.6  ALBUMIN 3.7   No results for input(s): LIPASE, AMYLASE in the last 168 hours. No results for input(s): AMMONIA in the last 168 hours. Coagulation Profile: No results for input(s): INR, PROTIME in the last 168 hours. Cardiac Enzymes:  Recent Labs Lab 03/30/2017 2058  TROPONINI 0.06*   BNP (last 3 results) No results for input(s): PROBNP in the last 8760 hours. HbA1C: No results for input(s): HGBA1C in the last 72 hours. CBG: No results for input(s): GLUCAP in the last 168 hours. Lipid Profile: No results for input(s): CHOL, HDL, LDLCALC, TRIG, CHOLHDL, LDLDIRECT in the last 72 hours. Thyroid Function Tests: No results for input(s): TSH, T4TOTAL, FREET4, T3FREE, THYROIDAB in the last 72 hours. Anemia Panel: No results for input(s): VITAMINB12, FOLATE, FERRITIN, TIBC, IRON, RETICCTPCT in the last 72 hours. Urine analysis:    Component Value Date/Time   COLORURINE YELLOW 01/24/2017 1925   APPEARANCEUR CLEAR 01/24/2017 1925   LABSPEC 1.006 01/24/2017 1925   PHURINE 6.0 01/24/2017 1925   GLUCOSEU NEGATIVE 01/24/2017 1925   HGBUR NEGATIVE 01/24/2017 1925   BILIRUBINUR NEGATIVE 01/24/2017 1925   KETONESUR NEGATIVE 01/24/2017 1925   PROTEINUR NEGATIVE 01/24/2017 1925   UROBILINOGEN 1.0 04/06/2015 1247   NITRITE NEGATIVE 01/24/2017 1925   LEUKOCYTESUR NEGATIVE 01/24/2017 1925    Radiological Exams on Admission: Dg Chest 2 View  Result Date: 03/21/2017 CLINICAL DATA:  Shortness of breath. EXAM: CHEST  2 VIEW COMPARISON:  November 12, 2015 FINDINGS: The small effusion and underlying opacity is seen in the left base, smaller in the interval. There may be a tiny effusion on the right as well. Suggested pulmonary venous congestion. No overt edema. No other interval changes or acute abnormalities. IMPRESSION: Small bilateral effusions, left greater than  right and pulmonary venous congestion. Electronically Signed   By: Gerome Sam III M.D   On: 03/23/2017 21:06    EKG: Independently reviewed.  Assessment/Plan Principal Problem:   Hypertensive urgency    1. HTN urgency - will treat as we did in 2017 1. Patient appears comfortable at this time sitting up in bed. 2. Continue home BP meds and give those that are due for tonight 3. Hydralazine 10mg  IV x1 now in ED 4. Then Hydralazine 5-10mg  IV Q4H PRN for SBP > 180 or DBP > 110 5. Lasix 40mg  IV x1 in ED, for now have written to continue home dose of 20mg  PO daily in AM (may want to give 2nd dose IV depending on response overnight) 6. BMP in AM 7. Doubt we will have need for anti-hypertensive gtt since we didn't need this last admission in March 2017 and she responded rather well to hydralazine.  DVT prophylaxis: Lovenox Code Status: DNR Family Communication: Grandson at bedside (Daughter is out of town this week). Disposition Plan: Back to ALF after admission Consults called: None Admission status: Place in obs.   Hillary Bow DO [facility] Pager [phone]  If 7AM-7PM, please contact day team taking care of patient [email or web address] Password [email or web address]  03/31/2017, 11:21 PM

## 2017-03-03 NOTE — ED Provider Notes (Signed)
[facility]-EMERGENCY DEPT Provider Note   CSN: [id number] Arrival date & time: 03/30/2017  2012     History   Chief Complaint Chief Complaint  Patient presents with  . Shortness of Breath  . Cough    HPI Alyssa [name] is a 83 y.o. female.  HPI Patient is 83 year old female who presents emergency department complaining of productive cough and lower extremity swelling over the past 3 days.  On my arrival to the patient's bedside her O2 sats are 86% on room air.  She also has a blood pressure of 210/114.  She reports compliance with her medications including her blood pressure meds.  She denies fever but reports some chills.  No history DVT or pulmonary embolism.  No prior history of congestive heart failure.  She denies active chest pain at this time.  Symptoms are moderate in severity.   Past Medical History:  Diagnosis Date  . Abdominal pain   . Abnormal gait   . Allergic rhinitis   . Altered mental status   . Bronchitis   . Candidal intertrigo   . Cataract   . Chronic GI hemorrhage   . Constipation   . Depression   . Diverticulosis   . Dizziness   . Dry eyes   . Dysphagia   . Edema   . Fatigue   . Foot drop   . Gait disturbance   . Gastritis   . GERD (gastroesophageal reflux disease)   . Hiatal hernia   . Hypercalcemia   . Hyperglycemia   . Hypertension   . Hypokalemia   . Hyponatremia   . Insomnia   . Intermittent claudication (HCC)   . Neck pain   . Neuropathy   . Onychomycosis   . Osteoarthritis   . Pancreatitis   . Peptic ulcer disease with hemorrhage   . Rectal bleed   . Renal disorder    CKD stage 3  . Restless leg syndrome   . Shoulder pain   . Sinusitis   . Thrush   . Tremor   . Urinary frequency   . Vision impairment   . Vitamin D deficiency     Patient Active Problem List   Diagnosis Date Noted  . Hypertensive urgency 11/12/2015  . Pleural effusion   . Depression   . Dyspnea     Past Surgical History:  Procedure Laterality Date  .  ABDOMINAL HYSTERECTOMY    . APPENDECTOMY    . CHOLECYSTECTOMY      OB History    No data available       Home Medications    Prior to Admission medications   Medication Sig Start Date End Date Taking? Authorizing Provider  acetaminophen (TYLENOL) 500 MG tablet Take 1,000 mg by mouth 2 (two) times daily.    Yes [provider]  fluticasone (FLONASE) 50 MCG/ACT nasal spray Place 2 sprays into both nostrils daily.   Yes [provider]  furosemide (LASIX) 20 MG tablet Take 20 mg by mouth daily.    Yes [provider]  hydrALAZINE (APRESOLINE) 25 MG tablet Take 25 mg by mouth 3 (three) times daily.   Yes [provider]  levocetirizine (XYZAL) 5 MG tablet Take 5 mg by mouth at bedtime.   Yes [provider]  losartan (COZAAR) 50 MG tablet Take 50 mg by mouth daily.   Yes [provider]  meclizine (ANTIVERT) 25 MG tablet Take 25 mg by mouth every 8 (eight) hours as needed for dizziness.  Yes [provider]  mirtazapine (REMERON) 30 MG tablet Take 30 mg by mouth at bedtime. Pt takes with a 7.5mg  tablet.   Yes [provider]  propranolol (INDERAL) 20 MG tablet Take 20 mg by mouth 3 (three) times daily.   Yes [provider]  sertraline (ZOLOFT) 25 MG tablet Take 25 mg by mouth daily.    Yes [provider]  Vitamin D, Ergocalciferol, (DRISDOL) 50000 units CAPS capsule Take 50,000 Units by mouth every 30 (thirty) days. Pt takes on the 11th of every month.   Yes [provider]  zolpidem (AMBIEN) 5 MG tablet Take 2.5 mg by mouth at bedtime.    Yes [provider]  Menthol, Topical Analgesic, (BIOFREEZE) 4 % GEL Apply 1 application topically every 8 (eight) hours as needed (for knee pain). Pt applies to left knee.    [provider]  senna (SENOKOT) 8.6 MG TABS tablet Take 1 tablet by mouth every 12 (twelve) hours as needed for mild constipation.    [provider]     Family History No family history on file.  Social History Social History  Substance Use Topics  . Smoking status: Former Smoker    Quit date: 11/12/1950  . Smokeless tobacco: Not on file  . Alcohol use No     Allergies   Iron and Voltaren [diclofenac sodium]   Review of Systems Review of Systems  All other systems reviewed and are negative.    Physical Exam Updated Vital Signs BP (!) 202/119   Pulse 96   Temp 98.9 F (37.2 C) (Oral)   Resp (!) 22   SpO2 96%   Physical Exam  Constitutional: She is oriented to person, place, and time. She appears well-developed and well-nourished. No distress.  HENT:  Head: Normocephalic and atraumatic.  Eyes: EOM are normal.  Neck: Normal range of motion.  Cardiovascular: Normal rate, regular rhythm and normal heart sounds.   Pulmonary/Chest: Effort normal. She has rales.  Abdominal: Soft. She exhibits no distension. There is no tenderness.  Musculoskeletal: Normal range of motion. She exhibits edema.  Neurological: She is alert and oriented to person, place, and time.  Skin: Skin is warm and dry.  Psychiatric: She has a normal mood and affect. Judgment normal.  Nursing note and vitals reviewed.    ED Treatments / Results  Labs (all labs ordered are listed, but only abnormal results are displayed) Labs Reviewed  COMPREHENSIVE METABOLIC PANEL - Abnormal; Notable for the following:       Result Value   Sodium 127 (*)    Chloride 88 (*)    Glucose, Bld 146 (*)    BUN 23 (*)    Creatinine, Ser 1.24 (*)    AST 56 (*)    Total Bilirubin 1.7 (*)    GFR calc non Af Amer 35 (*)    GFR calc Af Amer 41 (*)    All other components within normal limits  TROPONIN I - Abnormal; Notable for the following:    Troponin I 0.06 (*)    All other components within normal limits  BRAIN NATRIURETIC PEPTIDE - Abnormal; Notable for the following:    B Natriuretic Peptide 1,375.3 (*)    All other components within normal limits  CBC    BUN  Date Value Ref Range Status  12-May-2017 23 (H) 6 - 20 mg/dL Final  16/10/960405/24/2018 22 (H) 6 - 20 mg/dL Final  54/09/811903/13/2017 36 (H) 6 - 20 mg/dL Final  11/13/2015 27 (H) 6 - 20 mg/dL Final   Creatinine, Ser  Date Value Ref Range Status  03/10/2017 1.24 (H) 0.44 - 1.00 mg/dL Final  16/06/9603 5.40 (H) 0.44 - 1.00 mg/dL Final  98/07/9146 8.29 (H) 0.44 - 1.00 mg/dL Final  56/21/3086 5.78 (H) 0.44 - 1.00 mg/dL Final      EKG  EKG Interpretation  Date/Time:  Sunday March 03 2017 21:36:20 EDT Ventricular Rate:  96 PR Interval:    QRS Duration: 97 QT Interval:  368 QTC Calculation: 465 R Axis:   -48 Text Interpretation:  Normal sinus rhythm PR pronlongation LAD, consider left anterior fascicular block Left ventricular hypertrophy Anterior Q waves, possibly due to LVH No significant change was found Confirmed by Azalia Bilis ([id number]) on 03/14/2017 10:05:16 PM       Radiology Dg Chest 2 View  Result Date: 04/01/2017 CLINICAL DATA:  Shortness of breath. EXAM: CHEST  2 VIEW COMPARISON:  November 12, 2015 FINDINGS: The small effusion and underlying opacity is seen in the left base, smaller in the interval. There may be a tiny effusion on the right as well. Suggested pulmonary venous congestion. No overt edema. No other interval changes or acute abnormalities. IMPRESSION: Small bilateral effusions, left greater than right and pulmonary venous congestion. Electronically Signed   By: Gerome Sam III M.D   On: 03/27/2017 21:06    Procedures .Critical Care Performed by: Azalia Bilis Authorized by: Azalia Bilis    Total critical care time: 33 minutes Critical care time was exclusive of separately billable procedures and treating other patients. Critical care was necessary to treat or prevent imminent or life-threatening deterioration. Critical care was time spent personally by me on the following activities: development of treatment plan with patient and/or surrogate as well as nursing,  discussions with consultants, evaluation of patient's response to treatment, examination of patient, obtaining history from patient or surrogate, ordering and performing treatments and interventions, ordering and review of laboratory studies, ordering and review of radiographic studies, pulse oximetry and re-evaluation of patient's condition.   Medications Ordered in ED Medications  labetalol (NORMODYNE,TRANDATE) injection 10 mg (not administered)  furosemide (LASIX) injection 60 mg (not administered)  albuterol (PROVENTIL) (2.5 MG/3ML) 0.083% nebulizer solution 5 mg (5 mg Nebulization Given 03/15/2017 2027)     Initial Impression / Assessment and Plan / ED Course  I have reviewed the triage vital signs and the nursing notes.  Pertinent labs & imaging results that were available during my care of the patient were reviewed by me and considered in my medical decision making (see chart for details).     Hypertensive urgency/emergency with hypoxia and vascular congestion.  IV Lasix now.  Nitroglycerin for afterload reduction.   Final Clinical Impressions(s) / ED Diagnoses   Final diagnoses:  Acute on chronic congestive heart failure, unspecified heart failure type [facility])    New Prescriptions New Prescriptions   No medications on file     Azalia Bilis, MD 03/14/2017 2237

## 2017-03-03 NOTE — ED Triage Notes (Signed)
[facility] for short[facility] breath. Pt was having to stop while speaking to catch her breath. On arrival O2 was 92%. Pt has had a productive cough for 3 days. Facility did a portable xray but has not resulted. Pt denies feeling feverish. Pt was placed on O2 and the shortness of breath has improved

## 2017-03-03 NOTE — ED Notes (Addendum)
Lab called critical troponin of 0.06.

## 2017-03-03 NOTE — ED Notes (Signed)
Delay in lab draw,  Pt receiving peri care. 

## 2017-03-04 ENCOUNTER — Encounter (HOSPITAL_COMMUNITY): Payer: Self-pay

## 2017-03-04 ENCOUNTER — Observation Stay (HOSPITAL_COMMUNITY): Payer: Medicare Other

## 2017-03-04 DIAGNOSIS — J9602 Acute respiratory failure with hypercapnia: Secondary | ICD-10-CM | POA: Diagnosis not present

## 2017-03-04 DIAGNOSIS — I160 Hypertensive urgency: Secondary | ICD-10-CM | POA: Diagnosis not present

## 2017-03-04 DIAGNOSIS — I248 Other forms of acute ischemic heart disease: Secondary | ICD-10-CM | POA: Diagnosis not present

## 2017-03-04 DIAGNOSIS — N183 Chronic kidney disease, stage 3 unspecified: Secondary | ICD-10-CM

## 2017-03-04 DIAGNOSIS — Z515 Encounter for palliative care: Secondary | ICD-10-CM | POA: Diagnosis not present

## 2017-03-04 DIAGNOSIS — G929 Unspecified toxic encephalopathy: Secondary | ICD-10-CM

## 2017-03-04 DIAGNOSIS — G92 Toxic encephalopathy: Secondary | ICD-10-CM

## 2017-03-04 DIAGNOSIS — R1314 Dysphagia, pharyngoesophageal phase: Secondary | ICD-10-CM | POA: Diagnosis present

## 2017-03-04 DIAGNOSIS — I480 Paroxysmal atrial fibrillation: Secondary | ICD-10-CM | POA: Diagnosis present

## 2017-03-04 DIAGNOSIS — R531 Weakness: Secondary | ICD-10-CM | POA: Diagnosis not present

## 2017-03-04 DIAGNOSIS — Z7951 Long term (current) use of inhaled steroids: Secondary | ICD-10-CM | POA: Diagnosis not present

## 2017-03-04 DIAGNOSIS — R627 Adult failure to thrive: Secondary | ICD-10-CM | POA: Diagnosis present

## 2017-03-04 DIAGNOSIS — I130 Hypertensive heart and chronic kidney disease with heart failure and stage 1 through stage 4 chronic kidney disease, or unspecified chronic kidney disease: Secondary | ICD-10-CM | POA: Diagnosis not present

## 2017-03-04 DIAGNOSIS — J9601 Acute respiratory failure with hypoxia: Secondary | ICD-10-CM | POA: Diagnosis not present

## 2017-03-04 DIAGNOSIS — R1110 Vomiting, unspecified: Secondary | ICD-10-CM | POA: Diagnosis present

## 2017-03-04 DIAGNOSIS — R05 Cough: Secondary | ICD-10-CM | POA: Diagnosis not present

## 2017-03-04 DIAGNOSIS — Z79899 Other long term (current) drug therapy: Secondary | ICD-10-CM | POA: Diagnosis not present

## 2017-03-04 DIAGNOSIS — R1310 Dysphagia, unspecified: Secondary | ICD-10-CM | POA: Diagnosis not present

## 2017-03-04 DIAGNOSIS — Z66 Do not resuscitate: Secondary | ICD-10-CM | POA: Diagnosis not present

## 2017-03-04 DIAGNOSIS — J690 Pneumonitis due to inhalation of food and vomit: Secondary | ICD-10-CM | POA: Diagnosis not present

## 2017-03-04 DIAGNOSIS — E871 Hypo-osmolality and hyponatremia: Secondary | ICD-10-CM

## 2017-03-04 DIAGNOSIS — R0902 Hypoxemia: Secondary | ICD-10-CM | POA: Diagnosis present

## 2017-03-04 DIAGNOSIS — Z90710 Acquired absence of both cervix and uterus: Secondary | ICD-10-CM | POA: Diagnosis not present

## 2017-03-04 DIAGNOSIS — R54 Age-related physical debility: Secondary | ICD-10-CM | POA: Diagnosis present

## 2017-03-04 DIAGNOSIS — K219 Gastro-esophageal reflux disease without esophagitis: Secondary | ICD-10-CM | POA: Diagnosis not present

## 2017-03-04 DIAGNOSIS — I5031 Acute diastolic (congestive) heart failure: Secondary | ICD-10-CM | POA: Diagnosis not present

## 2017-03-04 DIAGNOSIS — Z888 Allergy status to other drugs, medicaments and biological substances status: Secondary | ICD-10-CM | POA: Diagnosis not present

## 2017-03-04 DIAGNOSIS — Z87891 Personal history of nicotine dependence: Secondary | ICD-10-CM | POA: Diagnosis not present

## 2017-03-04 DIAGNOSIS — N179 Acute kidney failure, unspecified: Secondary | ICD-10-CM | POA: Diagnosis not present

## 2017-03-04 DIAGNOSIS — E861 Hypovolemia: Secondary | ICD-10-CM | POA: Diagnosis present

## 2017-03-04 LAB — GLUCOSE, CAPILLARY: Glucose-Capillary: 140 mg/dL — ABNORMAL HIGH (ref 65–99)

## 2017-03-04 LAB — BLOOD GAS, ARTERIAL
ACID-BASE EXCESS: 4.5 mmol/L — AB (ref 0.0–2.0)
Acid-Base Excess: 4.4 mmol/L — ABNORMAL HIGH (ref 0.0–2.0)
BICARBONATE: 29.8 mmol/L — AB (ref 20.0–28.0)
Bicarbonate: 30.5 mmol/L — ABNORMAL HIGH (ref 20.0–28.0)
Delivery systems: POSITIVE
Drawn by: 275531
Drawn by: 129711
EXPIRATORY PAP: 6
FIO2: 40
INSPIRATORY PAP: 12
Mode: POSITIVE
O2 Content: 6 L/min
O2 SAT: 98 %
O2 Saturation: 97.5 %
PATIENT TEMPERATURE: 98.6
PCO2 ART: 55.8 mmHg — AB (ref 32.0–48.0)
PH ART: 7.348 — AB (ref 7.350–7.450)
PO2 ART: 117 mmHg — AB (ref 83.0–108.0)
Patient temperature: 98.6
pCO2 arterial: 65.7 mmHg (ref 32.0–48.0)
pH, Arterial: 7.289 — ABNORMAL LOW (ref 7.350–7.450)
pO2, Arterial: 110 mmHg — ABNORMAL HIGH (ref 83.0–108.0)

## 2017-03-04 LAB — URINALYSIS, ROUTINE W REFLEX MICROSCOPIC
Bilirubin Urine: NEGATIVE
Glucose, UA: NEGATIVE mg/dL
Hgb urine dipstick: NEGATIVE
Ketones, ur: NEGATIVE mg/dL
LEUKOCYTES UA: NEGATIVE
Nitrite: NEGATIVE
PROTEIN: 100 mg/dL — AB
RBC / HPF: NONE SEEN RBC/hpf (ref 0–5)
SPECIFIC GRAVITY, URINE: 1.013 (ref 1.005–1.030)
SQUAMOUS EPITHELIAL / LPF: NONE SEEN
pH: 5 (ref 5.0–8.0)

## 2017-03-04 LAB — BASIC METABOLIC PANEL
ANION GAP: 10 (ref 5–15)
BUN: 29 mg/dL — AB (ref 6–20)
CHLORIDE: 90 mmol/L — AB (ref 101–111)
CO2: 28 mmol/L (ref 22–32)
Calcium: 8.9 mg/dL (ref 8.9–10.3)
Creatinine, Ser: 1.27 mg/dL — ABNORMAL HIGH (ref 0.44–1.00)
GFR calc Af Amer: 39 mL/min — ABNORMAL LOW (ref >60)
GFR, EST NON AFRICAN AMERICAN: 34 mL/min — AB (ref >60)
GLUCOSE: 158 mg/dL — AB (ref 65–99)
POTASSIUM: 4.2 mmol/L (ref 3.5–5.1)
SODIUM: 128 mmol/L — AB (ref 135–145)

## 2017-03-04 LAB — TROPONIN I
TROPONIN I: 0.04 ng/mL — AB (ref <0.03)
Troponin I: 0.06 ng/mL (ref <0.03)

## 2017-03-04 MED ORDER — SODIUM CHLORIDE 0.9 % IV SOLN: INTRAVENOUS | Status: DC

## 2017-03-04 MED ORDER — ORAL CARE MOUTH RINSE
15.0000 mL | Freq: Two times a day (BID) | OROMUCOSAL | Status: DC
  Administered 2017-03-04 – 2017-03-07 (×5): 15 mL via OROMUCOSAL

## 2017-03-04 MED ORDER — SODIUM CHLORIDE 0.9 % IV SOLN
1.5000 g | Freq: Two times a day (BID) | INTRAVENOUS | Status: DC
  Administered 2017-03-04 – 2017-03-07 (×7): 1.5 g via INTRAVENOUS
  Filled 2017-03-04 (×9): qty 1.5

## 2017-03-04 MED ORDER — SODIUM CHLORIDE 0.9 % IV SOLN
INTRAVENOUS | Status: AC
  Administered 2017-03-04 – 2017-03-05 (×2): via INTRAVENOUS

## 2017-03-04 NOTE — Progress Notes (Signed)
Spoke with Crisoforo OxfordJennifer Suter, daughter, at [phone], to inform her that we have orders to transfer pt to stepdown unit 3 midwest Room 4.

## 2017-03-04 NOTE — Progress Notes (Addendum)
[facility] PROGRESS NOTE    Progress Note  Alyssa [name]  RUE:[id number] DOB: 01/08/1917 DOA: 04/01/17 PCP: Florentina Jenny, MD     Brief Narrative:   Alyssa [name] is an 83 y.o. female past medical history of accelerated hypertension with a prior admission back in March 2017 that comes in with a blood pressure in the 225's was treated with IV hydralazine.  Assessment/Plan:   Hypertensive urgency: On admission her blood pressure was greater than 200 she was started on IV and oral medication and her blood pressure now 179/95 with a pulse of 100. We'll continue to slowly improve her blood pressure. At this time which I keep in the range between 180 and 200. Recheck cardiac markers, check 12 lead ekg.  NewToxic encephalopathy: Of unclear etiology, her blood glucose is 140, her saturations are 94%, will check an ABG. Lungs sound clear physical exam. Check a CT scan of the head. He is on Zoloft and Ambien with a chronic renal disease stage III which could be contributing to her confusion, also her blood pressure was dropped from 225 to 140 which can't be contracting to her confusion. At this time we'll place her nothing by mouth she had an episode of vomiting. We'll start her on gentle IV fluid hydration. Concern for aspiration. Consult PMT. CXR showed new bibasilar infiltrates, start Unasyn.  CKD (chronic kidney disease), stage III: No previous creatinine to compare it with.  Hyponatremia She has positive JVD on physical exam chest x-ray appears to be clean, she was started on IV Lasix with mild improvement in her hyponatremia.   DVT prophylaxis: lovenox Family Communication:none Disposition Plan/Barrier to D/C: unable to determine Code Status:     Code Status Orders        Start     Ordered   04-01-2017 2322  Do not attempt resuscitation (DNR)  Continuous    Question Answer Comment  In the event of cardiac or respiratory ARREST Do not call a "code blue"   In the  event of cardiac or respiratory ARREST Do not perform Intubation, CPR, defibrillation or ACLS   In the event of cardiac or respiratory ARREST Use medication by any route, position, wound care, and other measures to relive pain and suffering. May use oxygen, suction and manual treatment of airway obstruction as needed for comfort.      04-01-17 2321    Code Status History    Date Active Date Inactive Code Status Order ID Comments User Context   11/12/2015  2:14 AM 11/14/2015  7:15 PM DNR [id number]  Hillary Bow, DO ED   11/12/2015  2:08 AM 11/12/2015  2:14 AM Full Code [id number]  Hillary Bow, DO ED    Advance Directive Documentation     Most Recent Value  Type of Advance Directive  Healthcare Power of Attorney  Pre-existing out of facility DNR order (yellow form or pink MOST form)  -  "MOST" Form in Place?  -        IV Access:    Peripheral IV   Procedures and diagnostic studies:   Dg Chest 2 View  Result Date: 04/01/2017 CLINICAL DATA:  Shortness of breath. EXAM: CHEST  2 VIEW COMPARISON:  November 12, 2015 FINDINGS: The small effusion and underlying opacity is seen in the left base, smaller in the interval. There may be a tiny effusion on the right as well. Suggested pulmonary venous congestion. No overt edema. No other interval changes or acute abnormalities. IMPRESSION: Small bilateral  effusions, left greater than right and pulmonary venous congestion. Electronically Signed   By: Gerome Samavid  Williams III M.D   On: 03/26/2017 21:06     Medical Consultants:    None.  Anti-Infectives:   None  Subjective:    Alyssa [name] she is able to answer commands but is not able to carry on a conversation. She denies any pain or shortness of breath.  Objective:    Vitals:   03/04/17 0118 03/04/17 0657 03/04/17 0752 03/04/17 1032  BP:  (!) 162/81 (!) 128/59 (!) 179/95  Pulse:  91 (!) 105 (!) 105  Resp:      Temp:  99 F (37.2 C)    TempSrc:  Oral Oral   SpO2:  97% 96%     Weight: 60.6 kg (133 lb 9.6 oz)     Height:        Intake/Output Summary (Last 24 hours) at 03/04/17 1038 Last data filed at 03/04/17 1012  Gross per 24 hour  Intake              175 ml  Output                0 ml  Net              175 ml   Filed Weights   03/04/17 0113 03/04/17 0118  Weight: 61.1 kg (134 lb 9.6 oz) 60.6 kg (133 lb 9.6 oz)    Exam: General exam: In no acute distress. Respiratory system: Good air movement and clear to auscultation. Cardiovascular system: Regular rate and rhythm with positive S1 and S2 positive JVD Gastrointestinal system: Abdomen is soft, nontender nondistended Central nervous system: Alert awake and oriented 1 moving all 4 extremities without any difficulties, following simple commands extraocular movements are intact. Extremities: No pedal edema. Skin: No rashes or ulceration.   Data Reviewed:    Labs: Basic Metabolic Panel:  Recent Labs Lab 03/26/2017 2058 03/04/17 0706  NA 127* 128*  K 4.5 4.2  CL 88* 90*  CO2 26 28  GLUCOSE 146* 158*  BUN 23* 29*  CREATININE 1.24* 1.27*  CALCIUM 9.3 8.9   GFR Estimated Creatinine Clearance: 23.2 mL/min (A) (by C-G formula based on SCr of 1.27 mg/dL (H)). Liver Function Tests:  Recent Labs Lab 03/22/2017 2058  AST 56*  ALT 45  ALKPHOS 89  BILITOT 1.7*  PROT 7.6  ALBUMIN 3.7   No results for input(s): LIPASE, AMYLASE in the last 168 hours. No results for input(s): AMMONIA in the last 168 hours. Coagulation profile No results for input(s): INR, PROTIME in the last 168 hours.  CBC:  Recent Labs Lab 03/20/2017 2058  WBC 10.4  HGB 12.9  HCT 38.5  MCV 90.2  PLT 152   Cardiac Enzymes:  Recent Labs Lab 03/19/2017 2058  TROPONINI 0.06*   BNP (last 3 results) No results for input(s): PROBNP in the last 8760 hours. CBG:  Recent Labs Lab 03/04/17 1026  GLUCAP 140*   D-Dimer: No results for input(s): DDIMER in the last 72 hours. Hgb A1c: No results for input(s): HGBA1C in  the last 72 hours. Lipid Profile: No results for input(s): CHOL, HDL, LDLCALC, TRIG, CHOLHDL, LDLDIRECT in the last 72 hours. Thyroid function studies: No results for input(s): TSH, T4TOTAL, T3FREE, THYROIDAB in the last 72 hours.  Invalid input(s): FREET3 Anemia work up: No results for input(s): VITAMINB12, FOLATE, FERRITIN, TIBC, IRON, RETICCTPCT in the last 72 hours. Sepsis Labs:  Recent Labs Lab 03/23/2017  2058  WBC 10.4   Microbiology No results found for this or any previous visit (from the past 240 hour(s)).   Medications:   . acetaminophen  1,000 mg Oral BID  . enoxaparin (LOVENOX) injection  30 mg Subcutaneous Q24H  . fluticasone  2 spray Each Nare Daily  . hydrALAZINE  25 mg Oral TID  . loratadine  10 mg Oral QHS  . losartan  50 mg Oral Daily  . mouth rinse  15 mL Mouth Rinse BID  . mirtazapine  30 mg Oral QHS  . propranolol  20 mg Oral TID  . sodium chloride flush  3 mL Intravenous Q12H   Continuous Infusions: . sodium chloride      Time spent: 35 min   LOS: 0 days   Marinda Elk  [facility] Pager [phone]  *Please refer to [email or web address], password [email or web address] to get updated schedule on who will round on this patient, as hospitalists switch teams weekly. If 7PM-7AM, please contact night-coverage at [email or web address], password [email or web address] for any overnight needs.  03/04/2017, 10:38 AM

## 2017-03-04 NOTE — Progress Notes (Signed)
During bedside report, pt was sleeping, with unlabored breathing via [place] 2L O2.   During assessment, pt was still sleeping, arousable to verbal stimulus, but very lethargic. At 1010, when attempting to give AM meds, pt's condition had changed, as she was very difficult to arouse, and still very lethargic.  BP 179/95, HR 104, CBG 140.  Although pt is DNR, RRT was called to come evaluate.  Dr. David StallFeliz Ortiz was also called to room.  Orders received for ABG STAT, CXR STAT, NS @ 1375mL/h.   ABG resulted pCO2 = 65.7; pH = 7.29.  Orders received to place pt on BiPap, and transfer to [facility], room 4.

## 2017-03-04 NOTE — Progress Notes (Signed)
[facility] A&0 x4. SR/ST on telemetry. VSS. No complaints of pain. No skin breakdown noted.

## 2017-03-04 NOTE — Significant Event (Signed)
Rapid Response Event Note  Overview:  Called to see patient with AMS Time Called: 1053 Arrival Time: 1058 Event Type: Neurologic  Initial Focused Assessment: On arrival patient arouses to DPS with purposeful movement otherwise lethargic - moves all 4 extremites.  She is hot to touch - dry.  Pale.  Resps slightly labored rate 26.  Bil BS distant.  No JVD noted - no extremity edema.  On monitor ST.  On 6 liters nasal cannula O2 sats 94%.  BP 183/102 - admitted with HTN urgency.  No focal neuro deficit noted at this time.  Dr. David StallFeliz-Ortiz and RN Martie LeeSabrina at bedside.  Reports patient is alert and oriented at baseline.    Interventions: Orders per MD:  ABG, PCXR, fluids.  Rectal temp done - with movement patient coughed thick secretions - O2 sats increased to 98%. Patient now opening eyes to stimulation - gripped and released to command with both hands but remains very lethargic.   ABG showing hypercarbia - placed on Bipap per order MD.  Patient tolerated well.  See RT note for details. CT scan head cancelled per MD.   Patient transferred to [facility] without incident.  Handoff per Martie LeeSabrina RN.    Plan of Care (if not transferred):  Event Summary: Name of Physician Notified: Dr.  David StallFeliz-Ortiz at  (at bedside)    at    Outcome: Transferred (Comment) (3[facility] 04)  Event End Time: 1145  Delton PrairieBritt, Nakiesha Rumsey L

## 2017-03-05 DIAGNOSIS — J9601 Acute respiratory failure with hypoxia: Secondary | ICD-10-CM

## 2017-03-05 DIAGNOSIS — J690 Pneumonitis due to inhalation of food and vomit: Secondary | ICD-10-CM

## 2017-03-05 DIAGNOSIS — N179 Acute kidney failure, unspecified: Secondary | ICD-10-CM

## 2017-03-05 DIAGNOSIS — J9602 Acute respiratory failure with hypercapnia: Secondary | ICD-10-CM

## 2017-03-05 LAB — BASIC METABOLIC PANEL
Anion gap: 9 (ref 5–15)
BUN: 31 mg/dL — AB (ref 6–20)
CHLORIDE: 92 mmol/L — AB (ref 101–111)
CO2: 30 mmol/L (ref 22–32)
CREATININE: 1.05 mg/dL — AB (ref 0.44–1.00)
Calcium: 8.9 mg/dL (ref 8.9–10.3)
GFR calc Af Amer: 50 mL/min — ABNORMAL LOW (ref >60)
GFR calc non Af Amer: 43 mL/min — ABNORMAL LOW (ref >60)
GLUCOSE: 162 mg/dL — AB (ref 65–99)
Potassium: 3.5 mmol/L (ref 3.5–5.1)
Sodium: 131 mmol/L — ABNORMAL LOW (ref 135–145)

## 2017-03-05 LAB — TROPONIN I: Troponin I: 0.03 ng/mL (ref <0.03)

## 2017-03-05 MED ORDER — PROPRANOLOL HCL 20 MG PO TABS
20.0000 mg | ORAL_TABLET | Freq: Three times a day (TID) | ORAL | Status: DC
  Administered 2017-03-05 – 2017-03-06 (×4): 20 mg via ORAL
  Filled 2017-03-05 (×5): qty 1

## 2017-03-05 MED ORDER — ZOLPIDEM TARTRATE 5 MG PO TABS
2.5000 mg | ORAL_TABLET | Freq: Once | ORAL | Status: AC
  Administered 2017-03-05: 2.5 mg via ORAL
  Filled 2017-03-05: qty 1

## 2017-03-05 MED ORDER — METOPROLOL TARTRATE 5 MG/5ML IV SOLN
2.5000 mg | Freq: Once | INTRAVENOUS | Status: AC
  Administered 2017-03-05: 2.5 mg via INTRAVENOUS
  Filled 2017-03-05: qty 5

## 2017-03-05 MED ORDER — METOPROLOL TARTRATE 5 MG/5ML IV SOLN
INTRAVENOUS | Status: AC
  Filled 2017-03-05: qty 5

## 2017-03-05 MED ORDER — METOPROLOL TARTRATE 5 MG/5ML IV SOLN
5.0000 mg | Freq: Once | INTRAVENOUS | Status: AC
  Administered 2017-03-05: 5 mg via INTRAVENOUS

## 2017-03-05 MED ORDER — METOPROLOL TARTRATE 5 MG/5ML IV SOLN
5.0000 mg | Freq: Three times a day (TID) | INTRAVENOUS | Status: DC | PRN
  Administered 2017-03-05: 5 mg via INTRAVENOUS
  Filled 2017-03-05: qty 5

## 2017-03-05 NOTE — Progress Notes (Signed)
Patient transferred to room 5W29. Report given to Raven, Charity fundraiserN.

## 2017-03-05 NOTE — Progress Notes (Signed)
[facility] PROGRESS NOTE    Progress Note  Alyssa [name]  ZOX:[id number] DOB: 28-Oct-1916 DOA: 03/09/17 PCP: Florentina Jenny, MD     Brief Narrative:   Alyssa [name] is an 83 y.o. female past medical history of accelerated hypertension with a prior admission back in March 2017 that comes in with a blood pressure in the 225's was treated with IV hydralazine.  Assessment/Plan:   Hypertensive urgency: BP improved ranging from 140's-160's. On losartan and propanolol, con to hold lasix until SLP done.  New Toxic encephalopathy/acute hypercarbic respiratory failure: Likely due to sudden drop in Bp, which lead to to aspiration PNA and hypercarbia, was on Bi-pap for 4hrs on 4.2.2017. Her hypercarbia did resolved her saturations are > 90% on nasala canula. ABG improved 7.3/55/117 SLP pending keep NPO. Consulted PMT. CXR showed new bibasilar infiltrates, start Unasyn.  Aspiration PNA: Started on Unasyn, CXR showed new bilateral lower lobe infiltrate. Sats improved. Has remained afebrile. awaiting SLP.  New onset afib: Multifactorial due to aspiration and that she was not given her propanolol. Will start it today and monitor she has a likely hood converting back into Sinus rhythm.  AKI: Unknown baseline. Lasix held, Cr improving with IV hydration, likely pre-renal.  Hyponatremia Improving with IV fluids. Likely due to hypovolemia.  Elevated troponin's: Likely due to demand ischemia in the setting of Paroxsymal a. fib she denies CP.   DVT prophylaxis: lovenox Family Communication:none Disposition Plan/Barrier to D/C: unable to determine Code Status:     Code Status Orders        Start     Ordered   Mar 09, 2017 2322  Do not attempt resuscitation (DNR)  Continuous    Question Answer Comment  In the event of cardiac or respiratory ARREST Do not call a "code blue"   In the event of cardiac or respiratory ARREST Do not perform Intubation, CPR, defibrillation or ACLS     In the event of cardiac or respiratory ARREST Use medication by any route, position, wound care, and other measures to relive pain and suffering. May use oxygen, suction and manual treatment of airway obstruction as needed for comfort.      March 09, 2017 2321    Code Status History    Date Active Date Inactive Code Status Order ID Comments User Context   11/12/2015  2:14 AM 11/14/2015  7:15 PM DNR [id number]  Hillary Bow, DO ED   11/12/2015  2:08 AM 11/12/2015  2:14 AM Full Code [id number]  Hillary Bow, DO ED    Advance Directive Documentation     Most Recent Value  Type of Advance Directive  Healthcare Power of Attorney  Pre-existing out of facility DNR order (yellow form or pink MOST form)  -  "MOST" Form in Place?  -        IV Access:    Peripheral IV   Procedures and diagnostic studies:   Dg Chest 2 View  Result Date: 2017-03-09 CLINICAL DATA:  Shortness of breath. EXAM: CHEST  2 VIEW COMPARISON:  November 12, 2015 FINDINGS: The small effusion and underlying opacity is seen in the left base, smaller in the interval. There may be a tiny effusion on the right as well. Suggested pulmonary venous congestion. No overt edema. No other interval changes or acute abnormalities. IMPRESSION: Small bilateral effusions, left greater than right and pulmonary venous congestion. Electronically Signed   By: Gerome Sam III M.D   On: 2017/03/09 21:06   Dg Chest Port 1 View  Result  Date: 03/04/2017 CLINICAL DATA:  Cough EXAM: PORTABLE CHEST 1 VIEW COMPARISON:  03/17/2017 FINDINGS: Cardiac shadow is again enlarged. Multiple calcified granulomas are again identified. Small effusions are again identified bilaterally. Patchy changes are noted in the bases bilaterally consistent with mild atelectasis. This is new from the prior exam. Stable vascular congestion is noted. No bony abnormality is seen. IMPRESSION: Stable small pleural effusions. New bibasilar atelectasis. Electronically Signed   By: Alcide CleverMark   Lukens M.D.   On: 03/04/2017 10:45     Medical Consultants:    None.  Anti-Infectives:   None  Subjective:    Alyssa [name] she is able to cary on a conversation. She relates she feels awfull due to persistent cough  Objective:    Vitals:   03/05/17 0100 03/05/17 0200 03/05/17 0413 03/05/17 0415  BP: (!) 155/91 (!) 154/94 (!) 158/86 (!) 158/86  Pulse: (!) 112 (!) 119 (!) 119 (!) 114  Resp: 18 19 20 20   Temp:    97.4 F (36.3 C)  TempSrc:    Axillary  SpO2: 100% 100% 95% 95%  Weight:      Height:        Intake/Output Summary (Last 24 hours) at 03/05/17 0728 Last data filed at 03/05/17 0636  Gross per 24 hour  Intake             1738 ml  Output              400 ml  Net             1338 ml   Filed Weights   03/04/17 0113 03/04/17 0118  Weight: 61.1 kg (134 lb 9.6 oz) 60.6 kg (133 lb 9.6 oz)    Exam: General exam: Laying comfortable in bed. Respiratory system: Good air movement with crackles on the right side. Cardiovascular system: irregular rate and rhythm with positive S1 and S2 - JVD Gastrointestinal system: + BS NT, ND, soft. Central nervous system: Alert awake and oriented 2 moving all 4 extremities without any difficulties,  Extremities: No pedal edema. Skin: No rashes or ulceration.   Data Reviewed:    Labs: Basic Metabolic Panel:  Recent Labs Lab 03/14/2017 2058 03/04/17 0706 03/05/17 0307  NA 127* 128* 131*  K 4.5 4.2 3.5  CL 88* 90* 92*  CO2 26 28 30   GLUCOSE 146* 158* 162*  BUN 23* 29* 31*  CREATININE 1.24* 1.27* 1.05*  CALCIUM 9.3 8.9 8.9   GFR Estimated Creatinine Clearance: 28 mL/min (A) (by C-G formula based on SCr of 1.05 mg/dL (H)). Liver Function Tests:  Recent Labs Lab 03/25/2017 2058  AST 56*  ALT 45  ALKPHOS 89  BILITOT 1.7*  PROT 7.6  ALBUMIN 3.7   No results for input(s): LIPASE, AMYLASE in the last 168 hours. No results for input(s): AMMONIA in the last 168 hours. Coagulation profile No results for  input(s): INR, PROTIME in the last 168 hours.  CBC:  Recent Labs Lab 03/30/2017 2058  WBC 10.4  HGB 12.9  HCT 38.5  MCV 90.2  PLT 152   Cardiac Enzymes:  Recent Labs Lab 03/20/2017 2058 03/04/17 1202 03/04/17 1603 03/04/17 2324  TROPONINI 0.06* 0.04* 0.06* 0.03*   BNP (last 3 results) No results for input(s): PROBNP in the last 8760 hours. CBG:  Recent Labs Lab 03/04/17 1026  GLUCAP 140*   D-Dimer: No results for input(s): DDIMER in the last 72 hours. Hgb A1c: No results for input(s): HGBA1C in the last 72 hours. Lipid  Profile: No results for input(s): CHOL, HDL, LDLCALC, TRIG, CHOLHDL, LDLDIRECT in the last 72 hours. Thyroid function studies: No results for input(s): TSH, T4TOTAL, T3FREE, THYROIDAB in the last 72 hours.  Invalid input(s): FREET3 Anemia work up: No results for input(s): VITAMINB12, FOLATE, FERRITIN, TIBC, IRON, RETICCTPCT in the last 72 hours. Sepsis Labs:  Recent Labs Lab Mar 27, 2017 2058  WBC 10.4   Microbiology No results found for this or any previous visit (from the past 240 hour(s)).   Medications:   . acetaminophen  1,000 mg Oral BID  . enoxaparin (LOVENOX) injection  30 mg Subcutaneous Q24H  . fluticasone  2 spray Each Nare Daily  . hydrALAZINE  25 mg Oral TID  . loratadine  10 mg Oral QHS  . losartan  50 mg Oral Daily  . mouth rinse  15 mL Mouth Rinse BID  . mirtazapine  30 mg Oral QHS  . propranolol  20 mg Oral TID  . sodium chloride flush  3 mL Intravenous Q12H   Continuous Infusions: . sodium chloride 75 mL/hr at 03/05/17 0657  . ampicillin-sulbactam (UNASYN) IV Stopped (03/05/17 0636)    Time spent: 35 min   LOS: 1 day   Marinda Elk  [facility] Pager [phone]  *Please refer to [email or web address], password [email or web address] to get updated schedule on who will round on this patient, as hospitalists switch teams weekly. If 7PM-7AM, please contact night-coverage at [email or web address], password [email or web address] for any overnight  needs.  03/05/2017, 7:28 AM

## 2017-03-05 NOTE — Evaluation (Signed)
Clinical/Bedside Swallow Evaluation Patient Details  Name: Alyssa [name] MRN: [id number] Date of Birth: 1917/06/18  Today's Date: 03/05/2017 Time: SLP Start Time (ACUTE ONLY): [id number] SLP Stop Time (ACUTE ONLY): 0838 SLP Time Calculation (min) (ACUTE ONLY): 12 min  Past Medical History:  Past Medical History:  Diagnosis Date  . Abdominal pain   . Abnormal gait   . Allergic rhinitis   . Altered mental status   . Bronchitis   . Candidal intertrigo   . Cataract   . Chronic GI hemorrhage   . Constipation   . Depression   . Diverticulosis   . Dizziness   . Dry eyes   . Dysphagia   . Edema   . Fatigue   . Foot drop   . Gait disturbance   . Gastritis   . GERD (gastroesophageal reflux disease)   . Hiatal hernia   . Hypercalcemia   . Hyperglycemia   . Hypertension   . Hypokalemia   . Hyponatremia   . Insomnia   . Intermittent claudication (HCC)   . Neck pain   . Neuropathy   . Onychomycosis   . Osteoarthritis   . Pancreatitis   . Peptic ulcer disease with hemorrhage   . Rectal bleed   . Renal disorder    CKD stage 3  . Restless leg syndrome   . Shoulder pain   . Sinusitis   . Thrush   . Tremor   . Urinary frequency   . Vision impairment   . Vitamin D deficiency    Past Surgical History:  Past Surgical History:  Procedure Laterality Date  . ABDOMINAL HYSTERECTOMY    . APPENDECTOMY    . CHOLECYSTECTOMY     HPI:  Alyssa [name] an 83 y.o.femalepast medical history of accelerated hypertension with a prior admission back in March 2017 that comes in with a blood pressure in the 225's was treated with IV hydralazine. Lead to hypercarbia, pneumonia.    Assessment / Plan / Recommendation Clinical Impression  Pt demonstrates concern for a primary esophageal or possibly cervical esophageal dysphagia. She reports her food and drinks "want to come back up" over the past several months. Minimal PO trials were given, but with only 3 sips of water and 1/2 teaspoon of  puree pt had multiple swallows, report of globus, delayed coughing and regurgitation of water. Presentation concerning for poor esophageal clearance of PO with possible complication of postprandial aspiration. Would recommend pt limit intake to full liquids, though suspect ongoing risk with all PO, particularly while decompensated and short of breath. Discussed with RN and plan to discuss with MD. Alyssa [name] if any interventions to benefit pt are feasible. Will plan for aspiration and esophageal precautions at this time.  SLP Visit Diagnosis: Dysphagia, pharyngoesophageal phase (R13.14)    Aspiration Risk  Severe aspiration risk;Risk for inadequate nutrition/hydration    Diet Recommendation Thin liquid   Liquid Administration via: Cup;Straw Medication Administration: Whole meds with liquid Supervision: Staff to assist with self feeding Compensations: Slow rate;Small sips/bites;Multiple dry swallows after each bite/sip Postural Changes: Seated upright at 90 degrees;Remain upright for at least 30 minutes after po intake    Other  Recommendations Oral Care Recommendations: Oral care QID Other Recommendations: Have oral suction available   Follow up Recommendations Skilled Nursing facility      Frequency and Duration min 2x/week  1 week       Prognosis Prognosis for Safe Diet Advancement: Guarded Barriers to Reach Goals: Severity of deficits  Swallow Study   General HPI: Alyssa [name] an 83 y.o.femalepast medical history of accelerated hypertension with a prior admission back in March 2017 that comes in with a blood pressure in the 225's was treated with IV hydralazine. Lead to hypercarbia, pneumonia.  Type of Study: Bedside Swallow Evaluation Previous Swallow Assessment: none Diet Prior to this Study: NPO Temperature Spikes Noted: No Respiratory Status: Nasal cannula History of Recent Intubation: No Behavior/Cognition: Cooperative;Lethargic/Drowsy Oral Cavity Assessment:  Within Functional Limits Oral Care Completed by SLP: No Oral Cavity - Dentition: Adequate natural dentition Vision: Functional for self-feeding Self-Feeding Abilities: Needs assist Patient Positioning: Upright in bed Baseline Vocal Quality: Low vocal intensity Volitional Cough: Congested Volitional Swallow: Able to elicit    Oral/Motor/Sensory Function Overall Oral Motor/Sensory Function: Within functional limits   Ice Chips Ice chips: Not tested   Thin Liquid Thin Liquid: Impaired Presentation: Straw Pharyngeal  Phase Impairments: Cough - Delayed;Multiple swallows    Nectar Thick Nectar Thick Liquid: Not tested   Honey Thick Honey Thick Liquid: Not tested   Puree Puree: Impaired Presentation: Spoon Pharyngeal Phase Impairments: Multiple swallows;Cough - Delayed   Solid   GO           Harlon Ditty, MA CCC-SLP [phone]  Alyssa [name] 03/05/2017,8:41 AM

## 2017-03-05 NOTE — Progress Notes (Signed)
   Patient ID: Alyssa DoryRuth [name], female   DOB: Jan 15, 1917, 83 y.o.   MRN: [id number]  Thank you for consulting [facility] to meet your patient's and family's needs.   The reason that you asked us to see your patient is:  For Establishing GOC  We have scheduled your patient for a meeting at family's request for tomorrow at 10:00 am with daughter and son  Your patient is able/unable to participate: yes  Lorinda CreedMary Syd Newsome NP  Palliative Medicine Team Team Phone # [phone] Pager [phone]  No charge

## 2017-03-06 DIAGNOSIS — Z66 Do not resuscitate: Secondary | ICD-10-CM

## 2017-03-06 DIAGNOSIS — R1319 Other dysphagia: Secondary | ICD-10-CM

## 2017-03-06 DIAGNOSIS — N179 Acute kidney failure, unspecified: Secondary | ICD-10-CM

## 2017-03-06 DIAGNOSIS — R531 Weakness: Secondary | ICD-10-CM

## 2017-03-06 DIAGNOSIS — Z515 Encounter for palliative care: Secondary | ICD-10-CM

## 2017-03-06 DIAGNOSIS — R059 Cough, unspecified: Secondary | ICD-10-CM

## 2017-03-06 DIAGNOSIS — R0902 Hypoxemia: Secondary | ICD-10-CM

## 2017-03-06 DIAGNOSIS — R05 Cough: Secondary | ICD-10-CM

## 2017-03-06 DIAGNOSIS — I160 Hypertensive urgency: Principal | ICD-10-CM

## 2017-03-06 DIAGNOSIS — N183 Chronic kidney disease, stage 3 (moderate): Secondary | ICD-10-CM

## 2017-03-06 DIAGNOSIS — R1310 Dysphagia, unspecified: Secondary | ICD-10-CM

## 2017-03-06 DIAGNOSIS — E871 Hypo-osmolality and hyponatremia: Secondary | ICD-10-CM

## 2017-03-06 DIAGNOSIS — J9602 Acute respiratory failure with hypercapnia: Secondary | ICD-10-CM

## 2017-03-06 DIAGNOSIS — G92 Toxic encephalopathy: Secondary | ICD-10-CM

## 2017-03-06 LAB — BASIC METABOLIC PANEL
ANION GAP: 5 (ref 5–15)
BUN: 32 mg/dL — ABNORMAL HIGH (ref 6–20)
CALCIUM: 9.1 mg/dL (ref 8.9–10.3)
CO2: 34 mmol/L — ABNORMAL HIGH (ref 22–32)
Chloride: 95 mmol/L — ABNORMAL LOW (ref 101–111)
Creatinine, Ser: 1.04 mg/dL — ABNORMAL HIGH (ref 0.44–1.00)
GFR, EST AFRICAN AMERICAN: 50 mL/min — AB (ref >60)
GFR, EST NON AFRICAN AMERICAN: 43 mL/min — AB (ref >60)
Glucose, Bld: 151 mg/dL — ABNORMAL HIGH (ref 65–99)
POTASSIUM: 4.1 mmol/L (ref 3.5–5.1)
SODIUM: 134 mmol/L — AB (ref 135–145)

## 2017-03-06 LAB — MRSA PCR SCREENING: MRSA BY PCR: POSITIVE — AB

## 2017-03-06 MED ORDER — CHLORHEXIDINE GLUCONATE CLOTH 2 % EX PADS
6.0000 | MEDICATED_PAD | Freq: Every day | CUTANEOUS | Status: DC
  Administered 2017-03-06 – 2017-03-08 (×3): 6 via TOPICAL

## 2017-03-06 MED ORDER — METOPROLOL TARTRATE 50 MG PO TABS
50.0000 mg | ORAL_TABLET | Freq: Two times a day (BID) | ORAL | Status: DC
  Administered 2017-03-06 – 2017-03-07 (×4): 50 mg via ORAL
  Filled 2017-03-06 (×4): qty 1

## 2017-03-06 MED ORDER — MUPIROCIN 2 % EX OINT
1.0000 "application " | TOPICAL_OINTMENT | Freq: Two times a day (BID) | CUTANEOUS | Status: DC
  Administered 2017-03-06 – 2017-03-07 (×4): 1 via NASAL
  Filled 2017-03-06 (×3): qty 22

## 2017-03-06 MED ORDER — DILTIAZEM HCL 60 MG PO TABS
60.0000 mg | ORAL_TABLET | Freq: Three times a day (TID) | ORAL | Status: DC
  Administered 2017-03-06 – 2017-03-07 (×5): 60 mg via ORAL
  Filled 2017-03-06 (×6): qty 1

## 2017-03-06 NOTE — [place] FL2 (Signed)
[place] MEDICAID FL2 LEVEL OF CARE SCREENING TOOL     IDENTIFICATION  Patient Name: Alyssa [name] Birthdate: 01-Nov-1916 Sex: female Admission Date (Current Location): 03/14/2017  [facility] [place] Number:  Producer, television/film/video and Address:  [facility] [place], [address] [place], [place] [address]      Provider Number: [id number]  Attending Physician Name and Address:  Efrain Sella, MD  Relative Name and Phone Number:       Current Level of Care: Hospital Recommended Level of Care: Skilled Nursing Facility Prior Approval Number:    Date Approved/Denied:   PASRR Number: [id number]  Discharge Plan: SNF    Current Diagnoses: Patient Active Problem List   Diagnosis Date Noted  . DNR (do not resuscitate) 03/06/2017  . Palliative care by specialist 03/06/2017  . Esophageal dysphagia   . Weakness   . Acute respiratory failure with hypercapnia (HCC) 03/05/2017  . Aspiration pneumonia due to gastric secretions (HCC) 03/05/2017  . AKI (acute kidney injury) (HCC) 03/05/2017  . Toxic encephalopathy 03/04/2017  . CKD (chronic kidney disease), stage III 03/04/2017  . Hyponatremia 03/04/2017  . Hypertensive urgency 11/12/2015  . Pleural effusion   . Depression   . Dyspnea     Orientation RESPIRATION BLADDER Height & Weight     Self, Situation, Place  Normal External catheter, Incontinent Weight: 133 lb (60.3 kg) Height:  5\' 6"  (167.6 cm)  BEHAVIORAL SYMPTOMS/MOOD NEUROLOGICAL BOWEL NUTRITION STATUS      Continent Diet (see DC summary)  AMBULATORY STATUS COMMUNICATION OF NEEDS Skin   Extensive Assist Verbally Normal                       Personal Care Assistance Level of Assistance  Bathing, Dressing Bathing Assistance: Maximum assistance   Dressing Assistance: Maximum assistance     Functional Limitations Info             SPECIAL CARE FACTORS FREQUENCY  PT (By licensed PT), OT (By licensed OT)     PT Frequency: 5/wk OT  Frequency: 5/wk            Contractures      Additional Factors Info  Code Status, Allergies, Isolation Precautions Code Status Info: DNR Allergies Info:  Iron, Voltaren Diclofenac Sodium     Isolation Precautions Info: pre     Current Medications (03/06/2017):  This is the current hospital active medication list Current Facility-Administered Medications  Medication Dose Route Frequency Provider Last Rate Last Dose  . acetaminophen (TYLENOL) tablet 1,000 mg  1,000 mg Oral BID Hillary Bow, DO   1,000 mg at 03/06/17 1129  . ampicillin-sulbactam (UNASYN) 1.5 g in sodium chloride 0.9 % 50 mL IVPB  1.5 g Intravenous Q12H Marinda Elk, MD   Stopped at 03/06/17 [phone]  . Chlorhexidine Gluconate Cloth 2 % PADS 6 each  6 each Topical Q0600 Marinda Elk, MD   6 each at 03/06/17 [phone]  . diltiazem (CARDIZEM) tablet 60 mg  60 mg Oral Q8H Sabia, Eiad, MD      . enoxaparin (LOVENOX) injection 30 mg  30 mg Subcutaneous Q24H Lyda Perone M, DO   30 mg at 03/06/17 1137  . fluticasone (FLONASE) 50 MCG/ACT nasal spray 2 spray  2 spray Each Nare Daily Lyda Perone M, DO      . loratadine (CLARITIN) tablet 10 mg  10 mg Oral QHS Lyda Perone M, DO   10 mg at 03/05/17 2145  .  meclizine (ANTIVERT) tablet 25 mg  25 mg Oral Q8H PRN Hillary BowGardner, Jared M, DO      . MEDLINE mouth rinse  15 mL Mouth Rinse BID Lyda PeroneGardner, Jared M, DO   15 mL at 03/06/17 1130  . metoprolol tartrate (LOPRESSOR) injection 5 mg  5 mg Intravenous Q8H PRN Marinda ElkFeliz Ortiz, Abraham, MD   5 mg at 03/05/17 1424  . metoprolol tartrate (LOPRESSOR) tablet 50 mg  50 mg Oral BID Efrain SellaSabia, Eiad, MD      . mirtazapine (REMERON) tablet 30 mg  30 mg Oral QHS Lyda PeroneGardner, Jared M, DO   30 mg at 03/05/17 2145  . mupirocin ointment (BACTROBAN) 2 % 1 application  1 application Nasal BID Marinda ElkFeliz Ortiz, Abraham, MD   1 application at 03/06/17 1131  . MUSCLE RUB CREA 1 application  1 application Topical Q8H PRN Hillary BowGardner, Jared M, DO      . ondansetron  [facility](ZOFRAN) tablet 4 mg  4 mg Oral Q6H PRN Hillary BowGardner, Jared M, DO       Or  . ondansetron [facility](ZOFRAN) injection 4 mg  4 mg Intravenous Q6H PRN Hillary BowGardner, Jared M, DO      . senna (SENOKOT) tablet 8.6 mg  1 tablet Oral Q12H PRN Hillary BowGardner, Jared M, DO      . sodium chloride flush (NS) 0.9 % injection 3 mL  3 mL Intravenous Q12H Lyda PeroneGardner, Jared M, DO   3 mL at 03/06/17 1130     Discharge Medications: Please see discharge summary for a list of discharge medications.  Relevant Imaging Results:  Relevant Lab Results:   Additional Information SSN: [id number]  Burna SisUris, Amina Menchaca H, LCSW

## 2017-03-06 NOTE — Clinical Social Work Note (Signed)
Clinical Social Work Assessment  Patient Details  Name: Alyssa [name] MRN: [id number] Date of Birth: February 21, 1917  Date of referral:  03/06/17               Reason for consult:  Facility Placement                Permission sought to share information with:  Oceanographeracility Contact Representative Permission granted to share information::  Yes, Verbal Permission Granted  Name::     Tax adviserBill  Agency::  SNF  Relationship::  son  Contact Information:     Housing/Transportation Living arrangements for the past 2 months:  Assisted Living Facility Source of Information:  Patient, Adult Children Patient Interpreter Needed:  None Criminal Activity/Legal Involvement Pertinent to Current Situation/Hospitalization:  No - Comment as needed Significant Relationships:  Adult Children Lives with:  Facility Resident Do you feel safe going back to the place where you live?  No Need for family participation in patient care:  Yes (Comment) (help with care coordination)  Care giving concerns:  Pt lives at ALF but now with increased impairment   Office managerocial Worker assessment / plan:  CSW spoke with pt and pt son concerning PT recommendation for SNF.  Explained SNF and SNF referral process.  Employment status:  Retired Health and safety inspectornsurance information:  Medicare PT Recommendations:  Skilled Nursing Facility Information / Referral to community resources:  Skilled Nursing Facility  Patient/Family's Response to care:  Patient originally against SNF- states she is ready to die and doesn't understand point of getting her strong just to decline.  CSW discussed with pt and son- encouraged pt to think about benefits of living the rest of her life as independent as possible- Patient now agreeable to SNF to try and increase independence.  Patient/Family's Understanding of and Emotional Response to Diagnosis, Current Treatment, and Prognosis:  Patient very realistic about her age and possible limitations but is hopeful that she can increase  some mobility so she can have better quality of life.  Emotional Assessment Appearance:  Appears stated age Attitude/Demeanor/Rapport:    Affect (typically observed):  Appropriate, Adaptable Orientation:  Oriented to Self, Oriented to Place, Oriented to Situation Alcohol / Substance use:  Not Applicable Psych involvement (Current and /or in the community):  No (Comment)  Discharge Needs  Concerns to be addressed:  Care Coordination Readmission within the last 30 days:  No Current discharge risk:  Physical Impairment Barriers to Discharge:  Continued Medical Work up   Burna SisUris, [name] H, LCSW 03/06/2017, 3:07 PM

## 2017-03-06 NOTE — Consult Note (Signed)
Consultation Note Date: 03/06/2017   Patient Name: Alyssa DoryRuth Mountz  DOB: 1917/02/11  MRN: [id number]  Age / Sex: 83 y.o., female  PCP: Florentina Jennyripp, Henry, MD Referring Physician: Efrain SellaSabia, Eiad, MD  Reason for Consultation: Establishing goals of care and Psychosocial/spiritual support  HPI/Patient Profile: 83 y.o. female   admitted on 03/24/2017 with  medical history significant of Accelerated HTN and prior admit for HTN urgency back in March of 2017.  Echo during that admission showed normal EF.  BPs were ~225 systolic in the ED.  Patient was treated with 20mg  IV hydralazine, home BPs continued, and she was given 40mg  IV lasix.  On admission to the ED,  c/o SOB and worsening peripheral edema.  BP has stabilized, now alert and oriented, treatment for aspiration PNA ongoing; she remains high risk for decompensation  She has had continued physical and functional decline over the past several months, she is weak and frail and failing to thrive.  Patient and family face advanced directive decsions and anticipatory care needs.  Clinical Assessment and Goals of Care:  This NP Lorinda CreedMary Talullah Abate reviewed medical records, received report from team, assessed the patient and then meet at the patient's bedside along with her son and daughter and SIL  to discuss diagnosis, prognosis, GOC, EOL wishes disposition and options.  A detailed discussion was had today regarding advanced directives.  Concepts specific to code status, artifical feeding and hydration, continued IV antibiotics and rehospitalization was had.  The difference between a aggressive medical intervention path  and a palliative comfort care path for this patient at this time was had.  Values and goals of care important to patient and family were attempted to be elicited.  Patient herself was able to verbalize to her family, "I am ready to die", "why am I trying to get better it  is my time".  Her daughter is very much encouraging further attempts at rehab.  MOST form introduced.  Concept of Hospice and Palliative Care were discussed  Natural trajectory and expectations at EOL were discussed.  Questions and concerns addressed.   Family encouraged to call with questions or concerns.  PMT will continue to support holistically.   NEXT OF KIN/ two children are main support persons    SUMMARY OF RECOMMENDATIONS    Code Status/Advance Care Planning:  DNR   Symptom Management:    Dysphagia: Diet as recommended by speech for least risk of aspiration.  Patient and family do not wish to pursue any further diagnostics for dysphagia.  Family understands the high risk for aspiration and secondary pneumonias.    Weakness: SNF for rehab at family request  Palliative Prophylaxis:   Aspiration, Bowel Regimen, Delirium Protocol, Frequent Pain Assessment and Oral Care  Additional Recommendations (Limitations, Scope, Preferences):  No Artificial Feeding  Psycho-social/Spiritual:   Desire for further Chaplaincy support:no  Additional Recommendations: Education on Hospice  Prognosis:   < 6 months  Discharge Planning: Skilled Nursing Facility for rehab with Palliative care service follow-up      Primary Diagnoses:  Present on Admission: . Hypertensive urgency   I have reviewed the medical record, interviewed the patient and family, and examined the patient. The following aspects are pertinent.  Past Medical History:  Diagnosis Date  . Abdominal pain   . Abnormal gait   . Allergic rhinitis   . Altered mental status   . Bronchitis   . Candidal intertrigo   . Cataract   . Chronic GI hemorrhage   . Constipation   . Depression   . Diverticulosis   . Dizziness   . Dry eyes   . Dysphagia   . Edema   . Fatigue   . Foot drop   . Gait disturbance   . Gastritis   . GERD (gastroesophageal reflux disease)   . Hiatal hernia   . Hypercalcemia   .  Hyperglycemia   . Hypertension   . Hypokalemia   . Hyponatremia   . Insomnia   . Intermittent claudication (HCC)   . Neck pain   . Neuropathy   . Onychomycosis   . Osteoarthritis   . Pancreatitis   . Peptic ulcer disease with hemorrhage   . Rectal bleed   . Renal disorder    CKD stage 3  . Restless leg syndrome   . Shoulder pain   . Sinusitis   . Thrush   . Tremor   . Urinary frequency   . Vision impairment   . Vitamin D deficiency    Social History   Social History  . Marital status: Widowed    Spouse name: N/A  . Number of children: N/A  . Years of education: N/A   Social History Main Topics  . Smoking status: Former Smoker    Quit date: 11/12/1950  . Smokeless tobacco: Never Used  . Alcohol use No  . Drug use: Unknown  . Sexual activity: Not Asked   Other Topics Concern  . None   Social History Narrative  . None   History reviewed. No pertinent family history. Scheduled Meds: . acetaminophen  1,000 mg Oral BID  . Chlorhexidine Gluconate Cloth  6 each Topical Q0600  . enoxaparin (LOVENOX) injection  30 mg Subcutaneous Q24H  . fluticasone  2 spray Each Nare Daily  . hydrALAZINE  25 mg Oral TID  . loratadine  10 mg Oral QHS  . mouth rinse  15 mL Mouth Rinse BID  . mirtazapine  30 mg Oral QHS  . mupirocin ointment  1 application Nasal BID  . propranolol  20 mg Oral TID  . sodium chloride flush  3 mL Intravenous Q12H   Continuous Infusions: . ampicillin-sulbactam (UNASYN) IV Stopped (03/06/17 0726)   PRN Meds:.meclizine, metoprolol tartrate, MUSCLE RUB, ondansetron **OR** ondansetron (ZOFRAN) IV, senna Medications Prior to Admission:  Prior to Admission medications   Medication Sig Start Date End Date Taking? Authorizing Provider  acetaminophen (TYLENOL) 500 MG tablet Take 1,000 mg by mouth 2 (two) times daily.    Yes [provider]  fluticasone (FLONASE) 50 MCG/ACT nasal spray Place 2 sprays into both nostrils daily.   Yes [provider]  furosemide (LASIX) 20 MG tablet Take 20 mg by mouth daily.    Yes [provider]  hydrALAZINE (APRESOLINE) 25 MG tablet Take 25 mg by mouth 3 (three) times daily.   Yes [provider]  levocetirizine (XYZAL) 5 MG tablet Take 5 mg by mouth at bedtime.   Yes [provider]  losartan (COZAAR) 50 MG tablet Take 50 mg by mouth  daily.   Yes [provider]  meclizine (ANTIVERT) 25 MG tablet Take 25 mg by mouth every 8 (eight) hours as needed for dizziness.    Yes [provider]  mirtazapine (REMERON) 30 MG tablet Take 30 mg by mouth at bedtime. Pt takes with a 7.5mg  tablet.   Yes [provider]  propranolol (INDERAL) 20 MG tablet Take 20 mg by mouth 3 (three) times daily.   Yes [provider]  sertraline (ZOLOFT) 25 MG tablet Take 25 mg by mouth daily.    Yes [provider]  Vitamin D, Ergocalciferol, (DRISDOL) 50000 units CAPS capsule Take 50,000 Units by mouth every 30 (thirty) days. Pt takes on the 11th of every month.   Yes [provider]  zolpidem (AMBIEN) 5 MG tablet Take 2.5 mg by mouth at bedtime.    Yes [provider]  Menthol, Topical Analgesic, (BIOFREEZE) 4 % GEL Apply 1 application topically every 8 (eight) hours as needed (for knee pain). Pt applies to left knee.    [provider]  senna (SENOKOT) 8.6 MG TABS tablet Take 1 tablet by mouth every 12 (twelve) hours as needed for mild constipation.    [provider]   Allergies  Allergen Reactions  . Iron Other (See Comments)    Reaction:  Unknown   . Voltaren [Diclofenac Sodium] Other (See Comments)    Reaction:  Unknown    Review of Systems  Neurological: Positive for weakness.    Physical Exam  Constitutional: She is oriented to person, place, and time. She appears ill.  Cardiovascular: Tachycardia present.   Pulmonary/Chest: She has decreased breath sounds in the right lower field and the left  lower field.  Neurological: She is alert and oriented to person, place, and time.  Skin: Skin is warm and dry.    Vital Signs: BP (!) 148/92 (BP Location: Right Arm)   Pulse (!) 114   Temp 98.6 F (37 C)   Resp 18   Ht 5\' 6"  (1.676 m)   Wt 60.3 kg (133 lb)   SpO2 94% Comment: corrected  BMI 21.47 kg/m  Pain Assessment: No/denies pain   Pain Score: 0-No pain   SpO2: SpO2: 94 % (corrected) O2 Device:SpO2: 94 % (corrected) O2 Flow Rate: .O2 Flow Rate (L/min): 4 L/min  IO: Intake/output summary:  Intake/Output Summary (Last 24 hours) at 03/06/17 1326 Last data filed at 03/06/17 0800  Gross per 24 hour  Intake              390 ml  Output              325 ml  Net               65 ml    LBM: Last BM Date: 03/05/17 Baseline Weight: Weight: 61.1 kg (134 lb 9.6 oz) Most recent weight: Weight: 60.3 kg (133 lb)     Palliative Assessment/Data: 30 % at best   Flowsheet Rows     Most Recent Value  Intake Tab  Referral Department  Hospitalist  Unit at Time of Referral  Cardiac/Telemetry Unit  Palliative Care Primary Diagnosis  Cardiac  Date Notified  03/04/17  Palliative Care Type  New Palliative care  Reason for referral  Clarify Goals of Care  Date of Admission  03/13/2017  # of days IP prior to Palliative referral  1  Clinical Assessment  Psychosocial & Spiritual Assessment  Palliative Care Outcomes    Discussed with Dr  Sabia  Time In: 0930 Time Out: 1115 Time Total: 90 min Greater than 50%  of this time was spent counseling and coordinating care related to the above assessment and plan.  Signed by: Lorinda Creed, NP   Please contact Palliative Medicine Team phone at [phone] for questions and concerns.  For individual provider: See Loretha Stapler

## 2017-03-06 NOTE — Evaluation (Signed)
Physical Therapy Evaluation Patient Details Name: Alyssa DoryRuth Langlois MRN: [id number] DOB: 01-Feb-1917 Today's Date: 03/06/2017   History of Present Illness  Pt is a 83 y/o female admitted secondary to SOB and worsening peripheral edema. Pt also found to have HTN urgency. PMH including but not limited to HTN and CKD.  Clinical Impression  Pt presented supine in bed with HOB elevated, awake and willing to participate in therapy session. Prior to admission, pt reported that she could ambulate some with her RW and used an electric w/c for mobility as well. Pt is from an ALF and the staff assist her with some ADLs. Pt currently requires mod A with bed mobility and mod A x2 for transfers with use of RW. Pt somewhat anxious and fearful of falling during transfers. PT currently recommending pt d/c to SNF; however, dependent on the family's goals for the pt (scheduled meeting with Palliative Care team today) these recommendations can be updated to meet the pt's needs. Pt would continue to benefit from skilled physical therapy services at this time while admitted and after d/c to address the below listed limitations in order to improve overall safety and independence with functional mobility.     Follow Up Recommendations SNF;Supervision/Assistance - 24 hour;Other (comment) (dependent on family's goals for pt)    Equipment Recommendations  None recommended by PT    Recommendations for Other Services       Precautions / Restrictions Precautions Precautions: Fall Restrictions Weight Bearing Restrictions: No      Mobility  Bed Mobility Overal bed mobility: Needs Assistance Bed Mobility: Supine to Sit     Supine to sit: Mod assist     General bed mobility comments: increased time, vc'ing for sequencing, use of bed rails, assistance with movement of bilateral LEs off of the bed and to elevate trunk to achieve sitting EOB with use of bed pads to position pt's hips  Transfers Overall transfer level:  Needs assistance Equipment used: Rolling walker (2 wheeled) Transfers: Sit to/from [employer]GI CorporationStand;Stand Pivot Transfers Sit to Stand: Mod assist;+2 physical assistance Stand pivot transfers: Mod assist;+2 physical assistance       General transfer comment: increased time, cueing for technique, mod A x2 to rise into standing from EOB and mod A x2 with pivotal movements to chair  Ambulation/Gait                Stairs            Wheelchair Mobility    Modified Rankin (Stroke Patients Only)       Balance Overall balance assessment: Needs assistance Sitting-balance support: Feet supported Sitting balance-Leahy Scale: Fair     Standing balance support: During functional activity;Bilateral upper extremity supported Standing balance-Leahy Scale: Poor Standing balance comment: pt reliant on bilateral UEs on RW and mod A x2                             Pertinent Vitals/Pain Pain Assessment: No/denies pain    Home Living Family/patient expects to be discharged to:: Assisted living               Home Equipment: Walker - 4 wheels;Wheelchair - power Additional Comments: pt has AFO's for bilateral foot drop per family; however, not present during evaluation    Prior Function Level of Independence: Needs assistance   Gait / Transfers Assistance Needed: pt ambulates with use of RW and has an electric w/c for further distances  ADL's /  Homemaking Assistance Needed: pt reported that she can dress herself but requires assistance with bathing        Hand Dominance        Extremity/Trunk Assessment   Upper Extremity Assessment Upper Extremity Assessment: Generalized weakness    Lower Extremity Assessment Lower Extremity Assessment: Generalized weakness    Cervical / Trunk Assessment Cervical / Trunk Assessment: Kyphotic  Communication   Communication: No difficulties  Cognition Arousal/Alertness: Awake/alert Behavior During Therapy: Anxious Overall  Cognitive Status: Impaired/Different from baseline Area of Impairment: Following commands;Safety/judgement;Problem solving                       Following Commands: Follows one step commands with increased time Safety/Judgement: Decreased awareness of safety   Problem Solving: Decreased initiation;Difficulty sequencing;Requires verbal cues;Requires tactile cues        General Comments      Exercises     Assessment/Plan    PT Assessment Patient needs continued PT services  PT Problem List Decreased strength;Decreased activity tolerance;Decreased balance;Decreased mobility;Decreased coordination;Decreased cognition;Decreased knowledge of use of DME;Decreased safety awareness       PT Treatment Interventions DME instruction;Gait training;Functional mobility training;Therapeutic activities;Therapeutic exercise;Balance training;Neuromuscular re-education;Patient/family education    PT Goals (Current goals can be found in the Care Plan section)  Acute Rehab PT Goals Patient Stated Goal: none stated PT Goal Formulation: With patient Time For Goal Achievement: 03/20/17 Potential to Achieve Goals: Fair    Frequency Min 2X/week   Barriers to discharge        Co-evaluation               AM-PAC PT "6 Clicks" Daily Activity  Outcome Measure Difficulty turning over in bed (including adjusting bedclothes, sheets and blankets)?: Total Difficulty moving from lying on back to sitting on the side of the bed? : Total Difficulty sitting down on and standing up from a chair with arms (e.g., wheelchair, bedside commode, etc,.)?: Total Help needed moving to and from a bed to chair (including a wheelchair)?: A Lot Help needed walking in hospital room?: A Lot Help needed climbing 3-5 steps with a railing? : Total 6 Click Score: 8    End of Session Equipment Utilized During Treatment: Gait belt Activity Tolerance: Patient limited by fatigue Patient left: in chair;with call  bell/phone within reach Nurse Communication: Mobility status PT Visit Diagnosis: Unsteadiness on feet (R26.81);Other abnormalities of gait and mobility (R26.89)    Time: 4098-1191 PT Time Calculation (min) (ACUTE ONLY): 26 min   Charges:   PT Evaluation $PT Eval Moderate Complexity: 1 Procedure PT Treatments $Therapeutic Activity: 8-22 mins   PT G Codes:        [place], PT, DPT [phone]   Alessandra Bevels Shambhavi Salley 03/06/2017, 11:40 AM

## 2017-03-06 NOTE — Progress Notes (Signed)
Pt c/o pain at iv site few minutes after iv unesyn was hang up, iv site looks good not infilterated, no blood return from iv med turned off I have put iv team consult and also informed kelly the first shift nurse that took over from me this morning to run the iv abx when iv team put another line

## 2017-03-06 NOTE — Progress Notes (Signed)
[facility] PROGRESS NOTE    Progress Note  Alyssa [name]  ZOX:[id number] DOB: February 04, 1917 DOA: 03/22/2017 PCP: Florentina Jenny, MD     Brief Narrative:   Alyssa [name] is an 83 y.o. female past medical history of accelerated hypertension with a prior admission back in March 2017 that comes in with a blood pressure in the 225's was treated with IV hydralazine.  Assessment/Plan:   Hypertensive urgency:  Resolved , add metoprolol instead of Inderal , stop Hydralazine as it causes reflex tachycardia .  New Toxic encephalopathy/acute hypercarbic respiratory failure: 2/2 Aspiration PNA , improved, avoid sedative meds .  Aspiration PNA:  2/2 dysphagia , Continue Unasyn , improving , aspiration precaution .  New onset afib:  Add cardizem for better rate control with BB , not good candidate for [facility] as she is high risk for fall .  AKI: Improved with gentle hydration .  Hyponatremia Improving with IV fluids. Likely due to hypovolemia.  Elevated troponin's: Likely due to demand ischemia in the setting of Paroxsymal a. fib she denies CP.  Goals of care addressed with the family DNR , Rehab efforts are needed , placement .   DVT prophylaxis: lovenox Family Communication:none Disposition Plan/Barrier to D/C: Mostly need rehab . Code Status:     Code Status Orders        Start     Ordered   22-Mar-2017 2322  Do not attempt resuscitation (DNR)  Continuous    Question Answer Comment  In the event of cardiac or respiratory ARREST Do not call a "code blue"   In the event of cardiac or respiratory ARREST Do not perform Intubation, CPR, defibrillation or ACLS   In the event of cardiac or respiratory ARREST Use medication by any route, position, wound care, and other measures to relive pain and suffering. May use oxygen, suction and manual treatment of airway obstruction as needed for comfort.      2017-03-22 2321    Code Status History    Date Active Date Inactive Code Status  Order ID Comments User Context   11/12/2015  2:14 AM 11/14/2015  7:15 PM DNR [id number]  Hillary Bow, DO ED   11/12/2015  2:08 AM 11/12/2015  2:14 AM Full Code [id number]  Hillary Bow, DO ED    Advance Directive Documentation     Most Recent Value  Type of Advance Directive  Healthcare Power of Attorney  Pre-existing out of facility DNR order (yellow form or pink MOST form)  -  "MOST" Form in Place?  -        IV Access:    Peripheral IV   Procedures and diagnostic studies:   No results found.   Medical Consultants:    None.  Anti-Infectives:   None  Subjective:    Still with cough , no fevers or chills , SOB with exertion , no N/V or abd pain .  Objective:    Vitals:   03/05/17 1504 03/05/17 1700 03/05/17 2145 03/06/17 0722  BP: (!) 148/66 (!) 158/83 (!) 153/93 (!) 148/92  Pulse: (!) 119 (!) 107 (!) 114 (!) 114  Resp: (!) 21 20 16 18   Temp: 98.1 F (36.7 C) 97.6 F (36.4 C) (!) 97.5 F (36.4 C) 98.6 F (37 C)  TempSrc: Axillary Oral    SpO2: 97% 95% 98% 94%  Weight:  60.3 kg (133 lb)    Height:  5\' 6"  (1.676 m)      Intake/Output Summary (Last 24 hours) at  03/06/17 1432 Last data filed at 03/06/17 0800  Gross per 24 hour  Intake              390 ml  Output              325 ml  Net               65 ml   Filed Weights   03/04/17 0113 03/04/17 0118 03/05/17 1700  Weight: 61.1 kg (134 lb 9.6 oz) 60.6 kg (133 lb 9.6 oz) 60.3 kg (133 lb)    Exam: General exam: participating in PT , no complaints. Respiratory system: some rhonchi in the right side, other than that CTAB. Cardiovascular system: irregular rate and rhythm with positive S1 and S2 - JVD Gastrointestinal system: + BS NT, ND, soft. Central nervous system: Alert awake and oriented 2 moving all 4 extremities without any difficulties,  Extremities: No pedal edema. Skin: No rashes or ulceration.   Data Reviewed:    Labs: Basic Metabolic Panel:  Recent Labs Lab 03/06/2017 2058  03/04/17 0706 03/05/17 0307 03/06/17 0608  NA 127* 128* 131* 134*  K 4.5 4.2 3.5 4.1  CL 88* 90* 92* 95*  CO2 26 28 30  34*  GLUCOSE 146* 158* 162* 151*  BUN 23* 29* 31* 32*  CREATININE 1.24* 1.27* 1.05* 1.04*  CALCIUM 9.3 8.9 8.9 9.1   GFR Estimated Creatinine Clearance: 28.3 mL/min (A) (by C-G formula based on SCr of 1.04 mg/dL (H)). Liver Function Tests:  Recent Labs Lab 03/30/2017 2058  AST 56*  ALT 45  ALKPHOS 89  BILITOT 1.7*  PROT 7.6  ALBUMIN 3.7   No results for input(s): LIPASE, AMYLASE in the last 168 hours. No results for input(s): AMMONIA in the last 168 hours. Coagulation profile No results for input(s): INR, PROTIME in the last 168 hours.  CBC:  Recent Labs Lab 03/06/2017 2058  WBC 10.4  HGB 12.9  HCT 38.5  MCV 90.2  PLT 152   Cardiac Enzymes:  Recent Labs Lab 03/14/2017 2058 03/04/17 1202 03/04/17 1603 03/04/17 2324  TROPONINI 0.06* 0.04* 0.06* 0.03*   BNP (last 3 results) No results for input(s): PROBNP in the last 8760 hours. CBG:  Recent Labs Lab 03/04/17 1026  GLUCAP 140*   D-Dimer: No results for input(s): DDIMER in the last 72 hours. Hgb A1c: No results for input(s): HGBA1C in the last 72 hours. Lipid Profile: No results for input(s): CHOL, HDL, LDLCALC, TRIG, CHOLHDL, LDLDIRECT in the last 72 hours. Thyroid function studies: No results for input(s): TSH, T4TOTAL, T3FREE, THYROIDAB in the last 72 hours.  Invalid input(s): FREET3 Anemia work up: No results for input(s): VITAMINB12, FOLATE, FERRITIN, TIBC, IRON, RETICCTPCT in the last 72 hours. Sepsis Labs:  Recent Labs Lab 03/19/2017 2058  WBC 10.4   Microbiology Recent Results (from the past 240 hour(s))  MRSA PCR Screening     Status: Abnormal   Collection Time: 03/06/17 12:38 AM  Result Value Ref Range Status   MRSA by PCR POSITIVE (A) NEGATIVE Final    Comment:        The GeneXpert MRSA Assay (FDA approved for NASAL specimens only), is one component of  a comprehensive MRSA colonization surveillance program. It is not intended to diagnose MRSA infection nor to guide or monitor treatment for MRSA infections. RESULT CALLED TO, READ BACK BY AND VERIFIED WITH: L. OSEI [id number] 07.04.2018 N. MORRIS      Medications:   . acetaminophen  1,000 mg Oral  BID  . Chlorhexidine Gluconate Cloth  6 each Topical Q0600  . enoxaparin (LOVENOX) injection  30 mg Subcutaneous Q24H  . fluticasone  2 spray Each Nare Daily  . hydrALAZINE  25 mg Oral TID  . loratadine  10 mg Oral QHS  . mouth rinse  15 mL Mouth Rinse BID  . mirtazapine  30 mg Oral QHS  . mupirocin ointment  1 application Nasal BID  . propranolol  20 mg Oral TID  . sodium chloride flush  3 mL Intravenous Q12H   Continuous Infusions: . ampicillin-sulbactam (UNASYN) IV Stopped (03/06/17 0726)    Time spent: 35 min   LOS: 2 days   Efrain Sella  [facility] Pager [phone]  *Please refer to [email or web address], password [email or web address] to get updated schedule on who will round on this patient, as hospitalists switch teams weekly. If 7PM-7AM, please contact night-coverage at [email or web address], password [email or web address] for any overnight needs.  03/06/2017, 2:32 PM

## 2017-03-06 NOTE — Progress Notes (Addendum)
  Speech Language Pathology Treatment: Dysphagia  Patient Details Name: Alyssa [name] MRN: [id number] DOB: 18-Sep-1916 Today's Date: 03/06/2017 Time: 1100-1120 SLP Time Calculation (min) (ACUTE ONLY): 20 min  Assessment / Plan / Recommendation Clinical Impression  Visited with family during palliative care meeting to discuss concern for esophageal dysphagia and potential precautions. Answered family's questions and provided printed education. Visited with pt, sitting up in chair. Persistent globus sensation reported with intake, delayed coughing concerning for backflow from the esophagus with aspiration observed with thin and thick liquids. Continue to suspect esophageal dysphagia and high risk of aspiration. Family to discuss plan of care. For now, will liberalize diet a bit to allow for some solids if desired, though esophageal precautions including upright for meals, following any soft moist solids with liquids and reducing oral bacteria with frequent oral care still recommended. Pt expected to continue to aspirate, family aware.    HPI HPI: Alyssa PopperRuth Panebakeris an 83 y.o.femalepast medical history of accelerated hypertension with a prior admission back in March 2017 that comes in with a blood pressure in the 225's was treated with IV hydralazine. Lead to hypercarbia, pneumonia.       SLP Plan  Continue with current plan of care       Recommendations  Diet recommendations: Thin liquid;Dysphagia 2 (fine chop) Liquids provided via: Cup;Straw Medication Administration: Crushed with puree                Oral Care Recommendations: Oral care BID Follow up Recommendations: Skilled Nursing facility SLP Visit Diagnosis: Dysphagia, pharyngoesophageal phase (R13.14) Plan: Continue with current plan of care       GO               [facility] Community HospitalBonnie Saira Kramme, MA CCC-SLP [phone]  Alyssa [name], Alyssa [name] 03/06/2017, 11:27 AM

## 2017-03-07 ENCOUNTER — Inpatient Hospital Stay (HOSPITAL_COMMUNITY): Payer: Medicare Other

## 2017-03-07 DIAGNOSIS — J690 Pneumonitis due to inhalation of food and vomit: Secondary | ICD-10-CM

## 2017-03-07 DIAGNOSIS — R627 Adult failure to thrive: Secondary | ICD-10-CM

## 2017-03-07 LAB — COMPREHENSIVE METABOLIC PANEL
ALBUMIN: 3.2 g/dL — AB (ref 3.5–5.0)
ALT: 64 U/L — ABNORMAL HIGH (ref 14–54)
ANION GAP: 11 (ref 5–15)
AST: 53 U/L — AB (ref 15–41)
Alkaline Phosphatase: 98 U/L (ref 38–126)
BILIRUBIN TOTAL: 0.9 mg/dL (ref 0.3–1.2)
BUN: 35 mg/dL — AB (ref 6–20)
CO2: 28 mmol/L (ref 22–32)
Calcium: 9.2 mg/dL (ref 8.9–10.3)
Chloride: 98 mmol/L — ABNORMAL LOW (ref 101–111)
Creatinine, Ser: 1.05 mg/dL — ABNORMAL HIGH (ref 0.44–1.00)
GFR calc Af Amer: 50 mL/min — ABNORMAL LOW (ref >60)
GFR calc non Af Amer: 43 mL/min — ABNORMAL LOW (ref >60)
GLUCOSE: 149 mg/dL — AB (ref 65–99)
POTASSIUM: 4.7 mmol/L (ref 3.5–5.1)
Sodium: 137 mmol/L (ref 135–145)
Total Protein: 6.5 g/dL (ref 6.5–8.1)

## 2017-03-07 LAB — CBC
HEMATOCRIT: 40.8 % (ref 36.0–46.0)
HEMOGLOBIN: 13.1 g/dL (ref 12.0–15.0)
MCH: 30 pg (ref 26.0–34.0)
MCHC: 32.1 g/dL (ref 30.0–36.0)
MCV: 93.4 fL (ref 78.0–100.0)
Platelets: 220 10*3/uL (ref 150–400)
RBC: 4.37 MIL/uL (ref 3.87–5.11)
RDW: 13.7 % (ref 11.5–15.5)
WBC: 8 10*3/uL (ref 4.0–10.5)

## 2017-03-07 MED ORDER — AMOXICILLIN-POT CLAVULANATE 875-125 MG PO TABS: 1.0000 | ORAL_TABLET | Freq: Two times a day (BID) | ORAL | 0 refills | Status: AC

## 2017-03-07 MED ORDER — DILTIAZEM HCL 60 MG PO TABS: 60.0000 mg | ORAL_TABLET | Freq: Three times a day (TID) | ORAL | Status: AC

## 2017-03-07 MED ORDER — ZOLPIDEM TARTRATE 5 MG PO TABS
2.5000 mg | ORAL_TABLET | Freq: Once | ORAL | Status: AC
  Administered 2017-03-07: 2.5 mg via ORAL
  Filled 2017-03-07: qty 1

## 2017-03-07 MED ORDER — METOPROLOL TARTRATE 50 MG PO TABS: 50.0000 mg | ORAL_TABLET | Freq: Two times a day (BID) | ORAL | Status: AC

## 2017-03-07 MED ORDER — FUROSEMIDE 10 MG/ML IJ SOLN
20.0000 mg | Freq: Once | INTRAMUSCULAR | Status: AC
  Administered 2017-03-07: 20 mg via INTRAVENOUS
  Filled 2017-03-07: qty 2

## 2017-03-07 NOTE — Progress Notes (Signed)
Patient ID: Kerry DoryRuth Gurka, female   DOB: [birth year], 83 y.o.   MRN: [id number]  This NP visited patient at the bedside as a follow up to  yesterday's GOCs meeting.  Meet with patient and her son in [place].  Plan is trial rehab at SNF, hope for improvement. Recommend PCS to follow at facility.   Discussed with patient and family the importance of continued conversation with family and their  medical providers regarding overall plan of care and treatment options,  ensuring decisions are within the context of the patients values and GOCs.  Questions and concerns addressed   Family encouraged to call with needs.  Time in 1015      Time out 1-35   Total time spent on the unit was  20 min   Greater than 50% of the time was spent in counseling and coordination of care  Lorinda CreedMary Davanna He NP  Palliative Medicine Team Team Phone # [phone] Pager [phone]

## 2017-03-07 NOTE — Discharge Summary (Signed)
Physician Discharge Summary  Patient ID: Alyssa [name] MRN: [id number] DOB/AGE: 81-Apr-1918 81 y.o.  Admit date: Mar 20, 2017 Discharge date: 03/07/2017  Admission Diagnoses:  Hypertensive urgency .    Discharge Diagnoses:    Hypertensive urgency  Toxic encephalopathy   CKD (chronic kidney disease), stage III   Hyponatremia   Acute respiratory failure with hypercapnia (HCC)   Aspiration pneumonia due to gastric secretions (HCC)   AKI / CKD (acute kidney injury) (HCC)   DNR (do not resuscitate)   Esophageal dysphagia      Discharged Condition:STABLE .  Hospital Course:   Alyssa [name] is a 83 y.o. female with medical history significant of Accelerated HTN Presents to the hospital with hypertensive urgency required to be admitted and started on IV blood pressure medications with subsequent improvement in her blood pressure, hospital course has been complicated with acute hypercarbic respiratory failure related to aspiration pneumonia that has been treated successfully with Unasyn patient was switched to oral Augmentin at the time of the discharge, patient has dysphagia and high risk for aspiration and she is on thin liquid diet  . Patient was initiated on metoprolol with Cardizem for blood pressure control and heart rate control in the setting of A. Fib , goals of care addressed with the family through palliative care who recommended DO NOT RESUSCITATE status but the family wanted rehabilitation to skilled nursing facility. The patient improved significantly and she will be discharged to the rehabilitation to continue rehabilitation efforts.  Consults: none .  Significant Diagnostic Studies:    Discharge Exam: Blood pressure (!) 156/100, pulse 97, temperature 97.6 F (36.4 C), temperature source Oral, resp. rate 18, height 5\' 6"  (1.676 m), weight 60.3 kg (133 lb), SpO2 98 %. GEN: NAD. Lungs : CTAB. Heart : S1S2, no Murmer. Abd: Soft , ND,NT. Ext : no edema .  Disposition:  03-Skilled Nursing Facility  Discharge Instructions    Diet - low sodium heart healthy   Thin liquid diet. Complete by:  As directed    Increase activity slowly    Complete by:  As directed      Allergies as of 03/07/2017      Reactions   Iron Other (See Comments)   Reaction:  Unknown    Voltaren [diclofenac Sodium] Other (See Comments)   Reaction:  Unknown       Medication List    STOP taking these medications   BIOFREEZE 4 % Gel Generic drug:  Menthol (Topical Analgesic)   furosemide 20 MG tablet Commonly known as:  LASIX   hydrALAZINE 25 MG tablet Commonly known as:  APRESOLINE   levocetirizine 5 MG tablet Commonly known as:  XYZAL   mirtazapine 30 MG tablet Commonly known as:  REMERON   propranolol 20 MG tablet Commonly known as:  INDERAL   zolpidem 5 MG tablet Commonly known as:  AMBIEN     TAKE these medications   acetaminophen 500 MG tablet Commonly known as:  TYLENOL Take 1,000 mg by mouth 2 (two) times daily.   amoxicillin-clavulanate 875-125 MG tablet Commonly known as:  AUGMENTIN Take 1 tablet by mouth 2 (two) times daily.   diltiazem 60 MG tablet Commonly known as:  CARDIZEM Take 1 tablet (60 mg total) by mouth every 8 (eight) hours.   fluticasone 50 MCG/ACT nasal spray Commonly known as:  FLONASE Place 2 sprays into both nostrils daily.   losartan 50 MG tablet Commonly known as:  COZAAR Take 50 mg by mouth daily.   meclizine 25  MG tablet Commonly known as:  ANTIVERT Take 25 mg by mouth every 8 (eight) hours as needed for dizziness.   metoprolol tartrate 50 MG tablet Commonly known as:  LOPRESSOR Take 1 tablet (50 mg total) by mouth 2 (two) times daily.   senna 8.6 MG Tabs tablet Commonly known as:  SENOKOT Take 1 tablet by mouth every 12 (twelve) hours as needed for mild constipation.   sertraline 25 MG tablet Commonly known as:  ZOLOFT Take 25 mg by mouth daily.   Vitamin D (Ergocalciferol) 50000 units Caps capsule Commonly  known as:  DRISDOL Take 50,000 Units by mouth every 30 (thirty) days. Pt takes on the 11th of every month.        Signed: Efrain Sellaiad Lakshmi Sundeen 03/07/2017, 3:28 PM

## 2017-03-07 NOTE — Progress Notes (Signed)
Patient will DC to: [facility] [facility] Room 211 Anticipated DC date: 03/07/17 Family notified: Son and daughter Transport by: Sharin MonsPTAR   Per MD patient ready for DC to [facility]. RN, patient, patient's family, and facility notified of DC. Discharge Summary sent to facility. RN given number for report [phone]). DC packet on chart. Ambulance transport requested for patient.   CSW signing off.  Cristobal GoldmannNadia Tameca Jerez, [place]CSWA Clinical Social Worker [phone]

## 2017-03-07 NOTE — Progress Notes (Signed)
Physical Therapy Treatment Patient Details Name: Alyssa [name] MRN: [id number] DOB: [birth year] Today's Date: 03/07/2017    History of Present Illness Pt is a 83 y/o female admitted secondary to SOB and worsening peripheral edema. Pt also found to have HTN urgency. PMH including but not limited to HTN and CKD.    PT Comments    Pt making slow progress towards achieving her current functional goals. She continues to be very limited in her mobility secondary to weakness and fatigue. PT continuing to recommend pt d/c to SNF for ST rehab services prior to returning home. Pt would continue to benefit from skilled physical therapy services at this time while admitted and after d/c to address the below listed limitations in order to improve overall safety and independence with functional mobility.    Follow Up Recommendations  SNF     Equipment Recommendations  None recommended by PT    Recommendations for Other Services       Precautions / Restrictions Precautions Precautions: Fall Restrictions Weight Bearing Restrictions: No    Mobility  Bed Mobility Overal bed mobility: Needs Assistance Bed Mobility: Supine to Sit     Supine to sit: Min assist     General bed mobility comments: increased time, vc'ing for sequencing, use of bed rails, assist with use of bed pads to position pt's hips at EOB  Transfers Overall transfer level: Needs assistance Equipment used: Rolling walker (2 wheeled) Transfers: Sit to/from Stand Sit to Stand: Min guard         General transfer comment: increased time, cueing for technique, min guard for safety with bed in elevated position  Ambulation/Gait Ambulation/Gait assistance: Min assist;+2 safety/equipment (close chair follow) Ambulation Distance (Feet): 10 Feet Assistive device: Rolling walker (2 wheeled) Gait Pattern/deviations: Step-through pattern;Decreased step length - right;Decreased step length - left;Decreased stride length;Decreased  dorsiflexion - right;Decreased dorsiflexion - left;Shuffle;Trunk flexed Gait velocity: decreased Gait velocity interpretation: <1.8 ft/sec, indicative of risk for recurrent falls General Gait Details: bilateral AFO's donned in sitting prior to standing and ambulating (pt with bilateral foot drop). Pt with decreased gait speed and fatiguing quickly with ambulation.    Stairs            Wheelchair Mobility    Modified Rankin (Stroke Patients Only)       Balance Overall balance assessment: Needs assistance Sitting-balance support: Feet supported Sitting balance-Leahy Scale: Fair     Standing balance support: During functional activity;Bilateral upper extremity supported Standing balance-Leahy Scale: Poor Standing balance comment: pt reliant on bilateral UEs on RW                             Cognition Arousal/Alertness: Awake/alert Behavior During Therapy: Flat affect Overall Cognitive Status: Impaired/Different from baseline Area of Impairment: Following commands;Safety/judgement;Problem solving                       Following Commands: Follows one step commands with increased time Safety/Judgement: Decreased awareness of safety   Problem Solving: Decreased initiation;Difficulty sequencing;Requires verbal cues;Requires tactile cues        Exercises      General Comments        Pertinent Vitals/Pain Pain Assessment: No/denies pain    Home Living                      Prior Function            PT  Goals (current goals can now be found in the care plan section) Acute Rehab PT Goals PT Goal Formulation: With patient Time For Goal Achievement: 03/20/17 Potential to Achieve Goals: Fair Progress towards PT goals: Progressing toward goals    Frequency    Min 2X/week      PT Plan Current plan remains appropriate    Co-evaluation              AM-PAC PT "6 Clicks" Daily Activity  Outcome Measure  Difficulty turning  over in bed (including adjusting bedclothes, sheets and blankets)?: A Lot Difficulty moving from lying on back to sitting on the side of the bed? : A Lot Difficulty sitting down on and standing up from a chair with arms (e.g., wheelchair, bedside commode, etc,.)?: Total Help needed moving to and from a bed to chair (including a wheelchair)?: A Little Help needed walking in hospital room?: A Little Help needed climbing 3-5 steps with a railing? : Total 6 Click Score: 12    End of Session Equipment Utilized During Treatment: Gait belt Activity Tolerance: Patient limited by fatigue Patient left: in chair;with call bell/phone within reach;with chair alarm set;with family/visitor present Nurse Communication: Mobility status PT Visit Diagnosis: Unsteadiness on feet (R26.81);Other abnormalities of gait and mobility (R26.89)     Time: [id number] PT Time Calculation (min) (ACUTE ONLY): 36 min  Charges:  $Gait Training: 8-22 mins $Therapeutic Activity: 8-22 mins                    G Codes:       MorgantonJennifer Tyliek [name], [place] CarolinaPT, [place]DPT [phone]    Alyssa BevelsJennifer [name] Alyssa [name] 03/07/2017, 3:33 PM

## 2017-03-07 NOTE — Clinical Social Work Placement (Signed)
   CLINICAL SOCIAL WORK PLACEMENT  NOTE  Date:  03/07/2017  Patient Details  Name: Alyssa [name] MRN: [id number] Date of Birth: 1916-12-05  Clinical Social Work is seeking post-discharge placement for this patient at the Skilled  Nursing Facility level of care (*CSW will initial, date and re-position this form in  chart as items are completed):  Yes   Patient/family provided with [facility] Clinical Social Work Department's list of facilities offering this level of care within the geographic area requested by the patient (or if unable, by the patient's family).  Yes   Patient/family informed of their freedom to choose among providers that offer the needed level of care, that participate in Medicare, Medicaid or managed care program needed by the patient, have an available bed and are willing to accept the patient.  Yes   Patient/family informed of [facility]'s ownership interest [facility] and [facility], as well as of the fact that they are under no obligation to receive care at these facilities.  PASRR submitted to EDS on 03/06/17     PASRR number received on 03/06/17     Existing PASRR number confirmed on       FL2 transmitted to all facilities in geographic area requested by pt/family on 03/06/17     FL2 transmitted to all facilities within larger geographic area on       Patient informed that his/her managed care company has contracts with or will negotiate with certain facilities, including the following:        Yes   Patient/family informed of bed offers received.  Patient chooses bed at [facility]     Physician recommends and patient chooses bed at      Patient to be transferred to [facility] on 03/07/17.  Patient to be transferred to facility by PTAR     Patient family notified on 03/07/17 of transfer.  Name of family member notified:  Son, Daughter     PHYSICIAN       Additional Comment:     _______________________________________________ Mearl LatinNadia S Zyler Hyson, LCSWA 03/07/2017, 3:43 PM

## 2017-03-07 NOTE — Progress Notes (Signed)
SLP Cancellation Note  Patient Details Name: Alyssa [name] MRN: [id number] DOB: Jul 26, 1917   Cancelled treatment:       Reason Eval/Treat Not Completed: Other (comment). Pt/family understand risks of aspiration and do not wish to pursue any further w/u for dysphagia. All education completed yesterday. Will sign off. Please reorder if needed.    Candas Deemer, Riley NearingBonnie [name] 03/07/2017, 7:59 AM

## 2017-03-07 NOTE — Progress Notes (Signed)
PTAR arrived to transport patient to SNF, patient c/o SOB, O2 sat 93% on RA, HR 112 and irregular. PTAR placed patient on 02 2l/Michie, HR came down to 96 and O2 sat improved to 96%.  BP also 174/100. RN assessed patient, BLL fine crackles noted, patient has congested cough.  Patient verbalized relief with O2. RN contacted Dr. Elige RadonKakrarakandy to make him aware of patient's condition, order received to hold discharge for tonight and obtain CXR and EKG and make MD aware of any changes. RN will continue to assess and monitor patient.  P.J. Henderson NewcomerSexton, RN

## 2017-03-08 DIAGNOSIS — R627 Adult failure to thrive: Secondary | ICD-10-CM

## 2017-03-08 LAB — COMPREHENSIVE METABOLIC PANEL
ALT: 53 U/L (ref 14–54)
AST: 33 U/L (ref 15–41)
Albumin: 3.2 g/dL — ABNORMAL LOW (ref 3.5–5.0)
Alkaline Phosphatase: 94 U/L (ref 38–126)
Anion gap: 10 (ref 5–15)
BILIRUBIN TOTAL: UNDETERMINED mg/dL (ref 0.3–1.2)
BUN: 33 mg/dL — AB (ref 6–20)
CO2: 31 mmol/L (ref 22–32)
CREATININE: 1.01 mg/dL — AB (ref 0.44–1.00)
Calcium: 9.3 mg/dL (ref 8.9–10.3)
Chloride: 96 mmol/L — ABNORMAL LOW (ref 101–111)
GFR, EST AFRICAN AMERICAN: 52 mL/min — AB (ref >60)
GFR, EST NON AFRICAN AMERICAN: 45 mL/min — AB (ref >60)
Glucose, Bld: 137 mg/dL — ABNORMAL HIGH (ref 65–99)
POTASSIUM: 4.5 mmol/L (ref 3.5–5.1)
Sodium: 137 mmol/L (ref 135–145)
TOTAL PROTEIN: 6.5 g/dL (ref 6.5–8.1)

## 2017-03-08 LAB — BLOOD GAS, ARTERIAL
ACID-BASE EXCESS: 9.4 mmol/L — AB (ref 0.0–2.0)
Acid-Base Excess: 9.3 mmol/L — ABNORMAL HIGH (ref 0.0–2.0)
BICARBONATE: 35.4 mmol/L — AB (ref 20.0–28.0)
Bicarbonate: 35.2 mmol/L — ABNORMAL HIGH (ref 20.0–28.0)
DELIVERY SYSTEMS: POSITIVE
DRAWN BY: 244851
Drawn by: 44898
EXPIRATORY PAP: 9
FIO2: 50
INSPIRATORY PAP: 18
O2 CONTENT: 3 L/min
O2 Saturation: 93.3 %
O2 Saturation: 98.5 %
PATIENT TEMPERATURE: 98.6
PO2 ART: 73.4 mmHg — AB (ref 83.0–108.0)
Patient temperature: 96.8
RATE: 10 resp/min
pCO2 arterial: 68.7 mmHg (ref 32.0–48.0)
pCO2 arterial: 64.7 mmHg — ABNORMAL HIGH (ref 32.0–48.0)
pH, Arterial: 7.332 — ABNORMAL LOW (ref 7.350–7.450)
pH, Arterial: 7.349 — ABNORMAL LOW (ref 7.350–7.450)
pO2, Arterial: 131 mmHg — ABNORMAL HIGH (ref 83.0–108.0)

## 2017-03-08 LAB — GLUCOSE, CAPILLARY: Glucose-Capillary: 138 mg/dL — ABNORMAL HIGH (ref 65–99)

## 2017-03-08 LAB — CBC
HEMATOCRIT: 41.7 % (ref 36.0–46.0)
Hemoglobin: 13.3 g/dL (ref 12.0–15.0)
MCH: 29.9 pg (ref 26.0–34.0)
MCHC: 31.9 g/dL (ref 30.0–36.0)
MCV: 93.7 fL (ref 78.0–100.0)
Platelets: 211 10*3/uL (ref 150–400)
RBC: 4.45 MIL/uL (ref 3.87–5.11)
RDW: 13.7 % (ref 11.5–15.5)
WBC: 7.7 10*3/uL (ref 4.0–10.5)

## 2017-03-08 MED ORDER — LORAZEPAM 2 MG/ML IJ SOLN
1.0000 mg | Freq: Once | INTRAMUSCULAR | Status: AC
  Administered 2017-03-08: 1 mg via INTRAVENOUS
  Filled 2017-03-08: qty 1

## 2017-03-08 MED ORDER — SODIUM CHLORIDE 0.9% FLUSH: 3.0000 mL | Freq: Two times a day (BID) | INTRAVENOUS | Status: DC

## 2017-03-08 MED ORDER — FUROSEMIDE 10 MG/ML IJ SOLN
40.0000 mg | Freq: Three times a day (TID) | INTRAMUSCULAR | Status: DC
  Administered 2017-03-08: 40 mg via INTRAVENOUS
  Filled 2017-03-08: qty 4

## 2017-03-08 MED ORDER — FUROSEMIDE 10 MG/ML IJ SOLN
INTRAMUSCULAR | Status: AC
  Filled 2017-03-08: qty 4

## 2017-03-08 MED ORDER — LORAZEPAM 2 MG/ML IJ SOLN: 1.0000 mg | INTRAMUSCULAR | Status: DC | PRN

## 2017-03-08 MED ORDER — HYDRALAZINE HCL 20 MG/ML IJ SOLN: 20.0000 mg | INTRAMUSCULAR | Status: DC | PRN

## 2017-03-08 MED ORDER — HALOPERIDOL 0.5 MG PO TABS: 0.5000 mg | ORAL_TABLET | ORAL | Status: DC | PRN

## 2017-03-08 MED ORDER — MORPHINE BOLUS VIA INFUSION
5.0000 mg | INTRAVENOUS | Status: DC | PRN
  Administered 2017-03-08 (×3): 5 mg via INTRAVENOUS
  Filled 2017-03-08: qty 5

## 2017-03-08 MED ORDER — PIPERACILLIN-TAZOBACTAM 3.375 G IVPB 30 MIN: 3.3750 g | Freq: Four times a day (QID) | INTRAVENOUS | Status: DC

## 2017-03-08 MED ORDER — NICARDIPINE HCL IN NACL 20-0.86 MG/200ML-% IV SOLN
3.0000 mg/h | INTRAVENOUS | Status: DC
  Filled 2017-03-08: qty 200

## 2017-03-08 MED ORDER — PIPERACILLIN-TAZOBACTAM 3.375 G IVPB
3.3750 g | Freq: Three times a day (TID) | INTRAVENOUS | Status: DC
  Filled 2017-03-08 (×2): qty 50

## 2017-03-08 MED ORDER — GLYCOPYRROLATE 1 MG PO TABS: 1.0000 mg | ORAL_TABLET | ORAL | Status: DC | PRN

## 2017-03-08 MED ORDER — SODIUM CHLORIDE 0.9 % IV SOLN: 250.0000 mL | INTRAVENOUS | Status: DC | PRN

## 2017-03-08 MED ORDER — HALOPERIDOL LACTATE 5 MG/ML IJ SOLN: 0.5000 mg | INTRAMUSCULAR | Status: DC | PRN

## 2017-03-08 MED ORDER — GLYCOPYRROLATE 0.2 MG/ML IJ SOLN
0.4000 mg | Freq: Three times a day (TID) | INTRAMUSCULAR | Status: DC
  Filled 2017-03-08 (×2): qty 2

## 2017-03-08 MED ORDER — BIOTENE DRY MOUTH MT LIQD: 15.0000 mL | OROMUCOSAL | Status: DC | PRN

## 2017-03-08 MED ORDER — GLYCOPYRROLATE 0.2 MG/ML IJ SOLN
0.2000 mg | INTRAMUSCULAR | Status: DC | PRN
  Filled 2017-03-08: qty 1

## 2017-03-08 MED ORDER — LABETALOL HCL 5 MG/ML IV SOLN
20.0000 mg | INTRAVENOUS | Status: DC | PRN
  Administered 2017-03-08: 20 mg via INTRAVENOUS
  Filled 2017-03-08: qty 4

## 2017-03-08 MED ORDER — SODIUM CHLORIDE 0.9% FLUSH: 3.0000 mL | INTRAVENOUS | Status: DC | PRN

## 2017-03-08 MED ORDER — AMOXICILLIN-POT CLAVULANATE 875-125 MG PO TABS: 1.0000 | ORAL_TABLET | Freq: Two times a day (BID) | ORAL | Status: DC

## 2017-03-08 MED ORDER — HALOPERIDOL LACTATE 2 MG/ML PO CONC
0.5000 mg | ORAL | Status: DC | PRN
  Filled 2017-03-08: qty 0.3

## 2017-03-08 MED ORDER — SODIUM CHLORIDE 0.9 % IV SOLN
10.0000 mg/h | INTRAVENOUS | Status: DC
  Administered 2017-03-08: 10 mg/h via INTRAVENOUS
  Filled 2017-03-08: qty 5
  Filled 2017-03-08: qty 10

## 2017-03-08 MED ORDER — DILTIAZEM HCL 25 MG/5ML IV SOLN
15.0000 mg | Freq: Four times a day (QID) | INTRAVENOUS | Status: DC | PRN
  Filled 2017-03-08: qty 5

## 2017-03-08 MED ORDER — MORPHINE BOLUS VIA INFUSION
5.0000 mg | INTRAVENOUS | Status: DC | PRN
  Filled 2017-03-08: qty 5

## 2017-03-08 MED ORDER — POLYVINYL ALCOHOL 1.4 % OP SOLN
1.0000 [drp] | Freq: Four times a day (QID) | OPHTHALMIC | Status: DC | PRN
  Filled 2017-03-08: qty 15

## 2017-03-08 MED ORDER — FUROSEMIDE 10 MG/ML IJ SOLN
40.0000 mg | Freq: Once | INTRAMUSCULAR | Status: AC
  Administered 2017-03-08: 40 mg via INTRAVENOUS

## 2017-04-03 NOTE — Progress Notes (Signed)
[facility] PROGRESS NOTE    Progress Note  Alyssa [name]  WUJ:[id number] DOB: 04-07-1917 DOA: 03/07/2017 PCP: Florentina Jennyripp, Henry, MD     Brief Narrative:   Alyssa [name] is an 83 y.o. female past medical history of accelerated hypertension with a prior admission back in March 2017 that comes in with a blood pressure in the 225's was treated with IV hydralazine.  Assessment/Plan:   1-Hypertensive emergency with pulmonary edema lead to acute on chronic pulmonary edema  :  Recurrent , will start  initiate BIPAP along with IV Labetalol /Hydralazine  , LASIX will be initiated .    2-Aspiration PNA:  2/2 dysphagia , switch to zosyn , repeat the CXR.  3-New onset afib:  Will use Cardizem PRN , Restart BB when she is more alert .   4-Elevated troponin's: Likely due to demand ischemia in the setting of Paroxsymal a. fib she denies CP.  Goals of care addressed with the family DNR , Rehab efforts are needed , placement .   DVT prophylaxis: lovenox Family Communication:none Disposition Plan/Barrier to D/C: Mostly need rehab . Code Status:     Code Status Orders        Start     Ordered   04/28/2017 2322  Do not attempt resuscitation (DNR)  Continuous    Question Answer Comment  In the event of cardiac or respiratory ARREST Do not call a "code blue"   In the event of cardiac or respiratory ARREST Do not perform Intubation, CPR, defibrillation or ACLS   In the event of cardiac or respiratory ARREST Use medication by any route, position, wound care, and other measures to relive pain and suffering. May use oxygen, suction and manual treatment of airway obstruction as needed for comfort.      04/28/2017 2321    Code Status History    Date Active Date Inactive Code Status Order ID Comments User Context   11/12/2015  2:14 AM 11/14/2015  7:15 PM DNR [id number]  Hillary BowGardner, Jared M, DO ED   11/12/2015  2:08 AM 11/12/2015  2:14 AM Full Code [id number]  Hillary BowGardner, Jared M, DO ED    Advance  Directive Documentation     Most Recent Value  Type of Advance Directive  Healthcare Power of Attorney  Pre-existing out of facility DNR order (yellow form or pink MOST form)  -  "MOST" Form in Place?  -        IV Access:    Peripheral IV   Procedures and diagnostic studies:   Dg Chest Port 1 View  Result Date: 03/07/2017 CLINICAL DATA:  Shortness of breath. EXAM: PORTABLE CHEST 1 VIEW COMPARISON:  March 04, 2017 FINDINGS: Bilateral pleural effusions and underlying opacities are larger in the interval. Cardiomegaly. No other interval changes. IMPRESSION: Increasing bilateral pleural effusions and underlying opacities. Electronically Signed   By: Gerome Samavid  Williams III M.D   On: 03/07/2017 20:53     Medical Consultants:    None.  Anti-Infectives:   None  Subjective:    The patient discharge was deferred because of sever SOB , overnight she received ambien and in the morning she was difficult to be aroused .  Objective:    Vitals:   03/06/17 2219 03/07/17 0604 03/07/17 2005 03/07/17 2115  BP: (!) 157/98 (!) 156/100 (!) 174/100 (!) 192/109  Pulse:  97 (!) 114 (!) 105  Resp:  18  (!) 22  Temp:      TempSrc:      SpO2:  98% 93% 98%  Weight:      Height:        Intake/Output Summary (Last 24 hours) at 03/06/2017 0722 Last data filed at 04/02/2017 1610  Gross per 24 hour  Intake                0 ml  Output             1400 ml  Net            -1400 ml   Filed Weights   03/04/17 0113 03/04/17 0118 03/05/17 1700  Weight: 61.1 kg (134 lb 9.6 oz) 60.6 kg (133 lb 9.6 oz) 60.3 kg (133 lb)    Exam: General exam: participating in PT , no complaints. Respiratory system: some rhonchi in the right side, other than that CTAB. Cardiovascular system: irregular rate and rhythm with positive S1 and S2 - JVD Gastrointestinal system: + BS NT, ND, soft. Central nervous system: Alert awake and oriented 2 moving all 4 extremities without any difficulties,  Extremities: No pedal  edema. Skin: No rashes or ulceration.   Data Reviewed:    Labs: Basic Metabolic Panel:  Recent Labs Lab 2017-03-17 2058 03/04/17 0706 03/05/17 0307 03/06/17 0608 03/07/17 0713  NA 127* 128* 131* 134* 137  K 4.5 4.2 3.5 4.1 4.7  CL 88* 90* 92* 95* 98*  CO2 26 28 30  34* 28  GLUCOSE 146* 158* 162* 151* 149*  BUN 23* 29* 31* 32* 35*  CREATININE 1.24* 1.27* 1.05* 1.04* 1.05*  CALCIUM 9.3 8.9 8.9 9.1 9.2   GFR Estimated Creatinine Clearance: 28 mL/min (A) (by C-G formula based on SCr of 1.05 mg/dL (H)). Liver Function Tests:  Recent Labs Lab 03-17-17 2058 03/07/17 0713  AST 56* 53*  ALT 45 64*  ALKPHOS 89 98  BILITOT 1.7* 0.9  PROT 7.6 6.5  ALBUMIN 3.7 3.2*   No results for input(s): LIPASE, AMYLASE in the last 168 hours. No results for input(s): AMMONIA in the last 168 hours. Coagulation profile No results for input(s): INR, PROTIME in the last 168 hours.  CBC:  Recent Labs Lab 17-Mar-2017 2058 03/07/17 0713  WBC 10.4 8.0  HGB 12.9 13.1  HCT 38.5 40.8  MCV 90.2 93.4  PLT 152 220   Cardiac Enzymes:  Recent Labs Lab 17-Mar-2017 2058 03/04/17 1202 03/04/17 1603 03/04/17 2324  TROPONINI 0.06* 0.04* 0.06* 0.03*   BNP (last 3 results) No results for input(s): PROBNP in the last 8760 hours. CBG:  Recent Labs Lab 03/04/17 1026 03/14/2017 0710  GLUCAP 140* 138*   D-Dimer: No results for input(s): DDIMER in the last 72 hours. Hgb A1c: No results for input(s): HGBA1C in the last 72 hours. Lipid Profile: No results for input(s): CHOL, HDL, LDLCALC, TRIG, CHOLHDL, LDLDIRECT in the last 72 hours. Thyroid function studies: No results for input(s): TSH, T4TOTAL, T3FREE, THYROIDAB in the last 72 hours.  Invalid input(s): FREET3 Anemia work up: No results for input(s): VITAMINB12, FOLATE, FERRITIN, TIBC, IRON, RETICCTPCT in the last 72 hours. Sepsis Labs:  Recent Labs Lab 2017/03/17 2058 03/07/17 0713  WBC 10.4 8.0   Microbiology Recent Results (from  the past 240 hour(s))  MRSA PCR Screening     Status: Abnormal   Collection Time: 03/06/17 12:38 AM  Result Value Ref Range Status   MRSA by PCR POSITIVE (A) NEGATIVE Final    Comment:        The GeneXpert MRSA Assay (FDA approved for NASAL specimens only), is one component  of a comprehensive MRSA colonization surveillance program. It is not intended to diagnose MRSA infection nor to guide or monitor treatment for MRSA infections. RESULT CALLED TO, READ BACK BY AND VERIFIED WITH: L. OSEI [id number] 07.04.2018 N. MORRIS      Medications:   . acetaminophen  1,000 mg Oral BID  . Chlorhexidine Gluconate Cloth  6 each Topical Q0600  . enoxaparin (LOVENOX) injection  30 mg Subcutaneous Q24H  . fluticasone  2 spray Each Nare Daily  . furosemide      . furosemide  40 mg Intravenous Once  . furosemide  40 mg Intravenous Q8H  . loratadine  10 mg Oral QHS  . mouth rinse  15 mL Mouth Rinse BID  . metoprolol tartrate  50 mg Oral BID  . mupirocin ointment  1 application Nasal BID  . sodium chloride flush  3 mL Intravenous Q12H   Continuous Infusions:   Time spent: 35 min   LOS: 4 days   Efrain Sella  [facility] Pager [phone]  *Please refer to [email or web address], password [email or web address] to get updated schedule on who will round on this patient, as hospitalists switch teams weekly. If 7PM-7AM, please contact night-coverage at [email or web address], password [email or web address] for any overnight needs.  03-23-2017, 7:22 AM

## 2017-04-03 NOTE — Significant Event (Signed)
Rapid Response Event Note  Overview:  Called by night shift for decreased LOC and decreased O2 saturations Time Called: 0651 Arrival Time: 0655 Event Type: Respiratory, Neurologic  Initial Focused Assessment:  Taking over for night shift RRT - on arrival patient supine in bed - warm and dry - responds to DPS with purposeful movement of upper extremities and grimace.  Pale.  Resps slightly labored with rate 28. O2 sats on 4 liter nasal cannula 94-98%.  Bil BS with rales - wet gurgling respirations.  BP 156/74 Afib 102.  Dr. Harle StanfordSabia at bedside. RN PJ at bedside.   CBG 138.     Interventions:  Stat ABG - patient with hypercarbia - Bipap initiated by RT - see flowsheet for details.  Repositioned in bed with HOB elevated - Lasix per MD order 40 mg IV given by PJ RN.  Patient tol Bipap - family updated by MD.  Transferred to 3MW room 09 on Bipap - handoff to Eastman Kodakadine RN.    Plan of Care (if not transferred):  Event Summary: Name of Physician Notified: Dr. Harle StanfordSabia at  (pta RRT)    at    Outcome: Transferred (Comment) (3mw 09)  Event End Time: 0915  Delton PrairieBritt, Waseem Suess L

## 2017-04-03 NOTE — Progress Notes (Signed)
Pt was on morphine gtt for comfort care prior to her passing. This RN wasted 215mL morphine IV. Witnessed by Kizzie BaneAmy Korslien RN. Wasted in sink.

## 2017-04-03 NOTE — Progress Notes (Signed)
Pt has expired. Time of death 2058. This RN and fellow RN Amy Korslien pronounced death. No palpable carotid pulses were felt over a period of 1 minute and no audible heart or breath sounds (all lung fields) were auscultated over a period of one minute. Pt was DNR at time of expiration and the family was present at the bedside.  MD notified.

## 2017-04-03 NOTE — Progress Notes (Signed)
Pt on comfort care,just started on morphine gtt, pt got restless PRN morphine bolus given, family at bedside.

## 2017-04-03 NOTE — Progress Notes (Signed)
Daily Progress Note   Patient Name: Alyssa [name]       Date: 04-03-17 DOB: [birth year]  Age: 83 y.o. MRN#: [id number] Attending Physician: Waldron Session, MD Primary Care Physician: Reymundo Poll, MD Admit Date: 03/25/2017  Reason for Consultation/Follow-up: Establishing goals of care Patient had an acute event when she was about to be discharged to SNF.  She is now obtunded and on Bipap with full support.  Subjective:   I spoke with son at bedside.  He tells me his mother does not want to go on and he understands why she feels this way.  We discussed that being on Bipap today has not changed how she appears on exam.  He comments that he recently had to have his dog put to sleep at the vet and it was very peaceful and humane.  He wishes a peaceful, humane death for his mother.  I described what comfort measures and a wean from Bipap would look like to him.  He stated he would like to resolve this as quickly as possible.     Afterward Bill and I met with his sister Nani Ravens) and her husband Linna Hoff.  We had an extended conversation about recurrent aspiration, the patient's current state of being, and removing Bipap.  They asked specific questions about the amount of Bipap support being given (20/12) vs the amount she had previously (12/6). All questions were answered.    After contemplation and tears the family decided to stop Bipap and move to full comfort.  I explained that she would likely not wake up again after the morphine infusion was started and it was very unlikely that she would survive long after the bipap was removed.  Melchor Amour and Rush Landmark understand that she will most likely pass away this evening.   Assessment: 83 yo female on full bipap support likely with recurrent aspiration  pneumonia and pulmonary edema after an acute event last evening.  CXR shows increased bilateral opacities and pleural effusions.   Patient Profile/HPI: 83 y.o. female   admitted on 03/14/2017 with  medical history significant of Accelerated HTN and prior admit for HTN urgency back in March of 2017. Echo during that admission showed normal EF. BPs were ~421 systolic in the ED. On admission to the ED,  c/o SOB and worsening peripheral edema.  She was treated for aspiration pneumonia.  BP normalized and she was about to be discharged on 7/5 when she had another acute event.  Length of Stay: 4  Current Medications: Scheduled Meds:  . LORazepam  1 mg Intravenous Once  . mouth rinse  15 mL Mouth Rinse BID  . sodium chloride flush  3 mL Intravenous Q12H  . sodium chloride flush  3 mL Intravenous Q12H    Continuous Infusions: . sodium chloride    . morphine      PRN Meds: sodium chloride, antiseptic oral rinse, glycopyrrolate **OR** glycopyrrolate **OR** glycopyrrolate, haloperidol **OR** haloperidol **OR** haloperidol lactate, LORazepam, morphine, [DISCONTINUED] ondansetron **OR** ondansetron (ZOFRAN) IV, polyvinyl alcohol, sodium chloride flush  Physical Exam        Frail elderly female,  on full bipap support.  Withdraws to pain.  Shes her head "no" that she is not uncomfortable. CV tachy resp coarse Able thin  Extremities 1-2+ edema.  Vital Signs: BP 122/81 (BP Location: Right Arm)   Pulse (!) 111   Temp 97.8 F (36.6 C) (Axillary)   Resp (!) 22   Ht '5\' 6"'  (1.676 m)   Wt 64.9 kg (143 lb)   SpO2 99%   BMI 23.08 kg/m  SpO2: SpO2: 99 % O2 Device: O2 Device: Bi-PAP O2 Flow Rate: O2 Flow Rate (L/min): 2 L/min  Intake/output summary:   Intake/Output Summary (Last 24 hours) at 2017-03-10 1712 Last data filed at 10-Mar-2017 1115  Gross per 24 hour  Intake                0 ml  Output             1250 ml  Net            -1250 ml   LBM: Last BM Date: 03/05/17 Baseline Weight:  Weight: 61.1 kg (134 lb 9.6 oz) Most recent weight: Weight: 64.9 kg (143 lb)       Palliative Assessment/Data:    Flowsheet Rows     Most Recent Value  Intake Tab  Referral Department  Hospitalist  Unit at Time of Referral  Cardiac/Telemetry Unit  Palliative Care Primary Diagnosis  Cardiac  Date Notified  03/04/17  Palliative Care Type  New Palliative care  Reason for referral  Clarify Goals of Care  Date of Admission  03/28/2017  # of days IP prior to Palliative referral  1  Clinical Assessment  Palliative Performance Scale Score  20%  Psychosocial & Spiritual Assessment  Palliative Care Outcomes  Patient/Family meeting held?  Yes  Who was at the meeting?  son, dtr, son in law  Palliative Care Outcomes  Changed to focus on comfort      Patient Active Problem List   Diagnosis Date Noted  . Adult failure to thrive   . DNR (do not resuscitate) 03/06/2017  . Palliative care by specialist 03/06/2017  . Esophageal dysphagia   . Weakness   . Cough   . Hypoxia   . Acute respiratory failure with hypercapnia ([place]) 03/05/2017  . Aspiration pneumonia due to gastric secretions ([place]) 03/05/2017  . AKI (acute kidney injury) ([place]) 03/05/2017  . Toxic encephalopathy 03/04/2017  . CKD (chronic kidney disease), stage III 03/04/2017  . Hyponatremia 03/04/2017  . Hypertensive urgency 11/12/2015  . Pleural effusion   . Depression   . Dyspnea     Palliative Care Plan    Recommendations/Plan:  Infuse morphine gtt x 3 hours then bolus x 1 and remove bipap  Give 1 dose ativan prior to bipap removal  PRN bolus of morphine during terminal wean for comfort  PRN q 15 min bolus of ativan for comfort  Goals of Care and Additional Recommendations:  Limitations on Scope of Treatment: Full Comfort Care  Code Status:  DNR  Prognosis:   Hours - Days   Discharge Planning:  Anticipated Hospital Death  Care plan was discussed with attending physician, RN, charge RN, Respiratory,  Family.  Thank you for allowing the Palliative Medicine Team to assist in the care of this patient.  Total time spent:  120 min. Time in 3:00 Time out 5:00 pm    Greater than 50%  of this time was spent counseling and coordinating care related to the above assessment and plan.  Florentina Jenny, PA-C Palliative Medicine  Please contact Palliative MedicineTeam phone at [phone] for questions and concerns between 7 am - 7 pm.   Please see AMION for individual provider pager numbers.

## 2017-04-03 NOTE — Progress Notes (Signed)
Patient only responsive to painful stimuli, O2 sat 83% on O2 2l/, rapid response called.  Patient's respirations labored, congested cough noted. Rapid response team arrived and assessed patient, Dr. Harle StanfordSabia notified and arrived to floor. Orders received and implemented, Lasix 40 mg IV given per order. MD requested patient transfer to Step Down level of care. Report called to [place] on [id number]. Patient placed on bipap for transfer. Patient's belongings gathered and taken to receiving unit. Dr. Harle StanfordSabia updated daughter.  P.J. Henderson NewcomerSexton, RN

## 2017-04-03 NOTE — Progress Notes (Signed)
Pt removed from BiPAP at 2037.  Vital signs stable at this time. Ativan 1mg  IV given and 5 mg morphine bolus given prior to removal. No apparent distress at this time, will continue to treat with ativan and morphine as appropriate.  Will continue to monitor.

## 2017-04-03 DEATH — deceased

## 2019-02-11 IMAGING — CT CT HEAD W/O CM
3 of 8 series · 14 of 47 positions shown, 17 images · non-contrast
Comparison: Head CT without contrast 04/06/2015.

CLINICAL DATA: [AGE] female unwitnessed fall. Scalp hematoma.

EXAM:
CT HEAD WITHOUT CONTRAST
CT CERVICAL SPINE WITHOUT CONTRAST
TECHNIQUE: Multidetector CT imaging of the head and cervical spine was
performed following the standard protocol without intravenous
contrast. Multiplanar CT image reconstructions of the cervical spine
were also generated.

[Series 9: coronal · coronal · 0.29mm/px · 3 of 66 slices shown]
[im 25/66  brain]
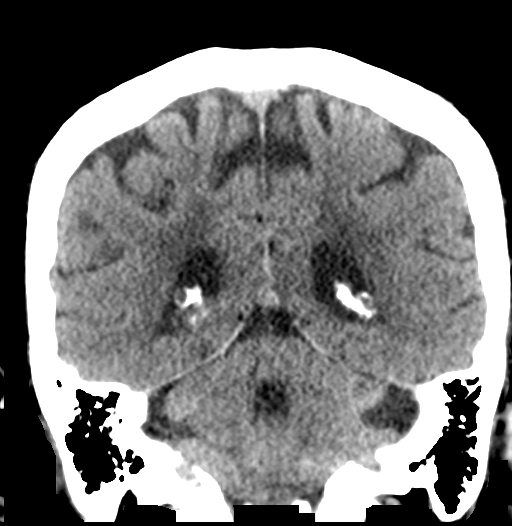
[im 33/66  brain]
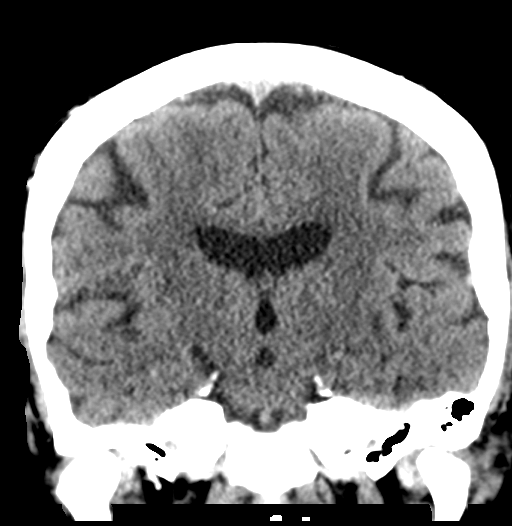
[im 41/66  brain]
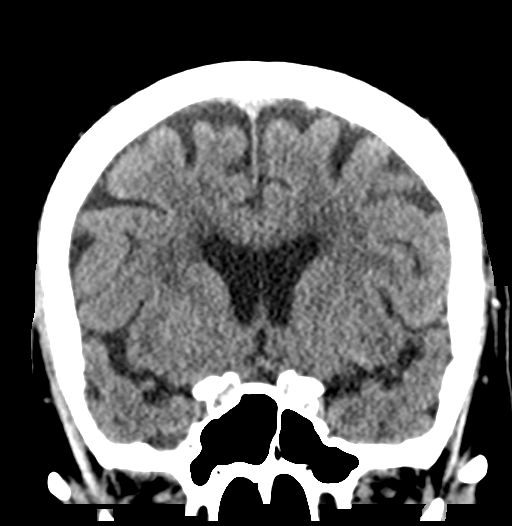

[Series 11: axial recon · axial · 0.23mm/px · z∈[-333,-189]mm · 9 of 103 slices shown, 12 images]
[im 11/103  brain]
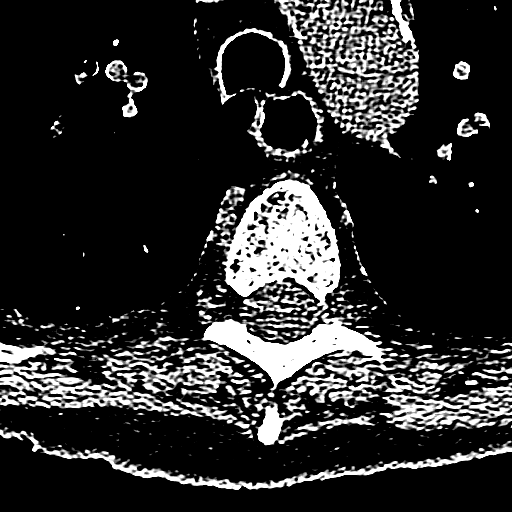
[im 11/103  bone]
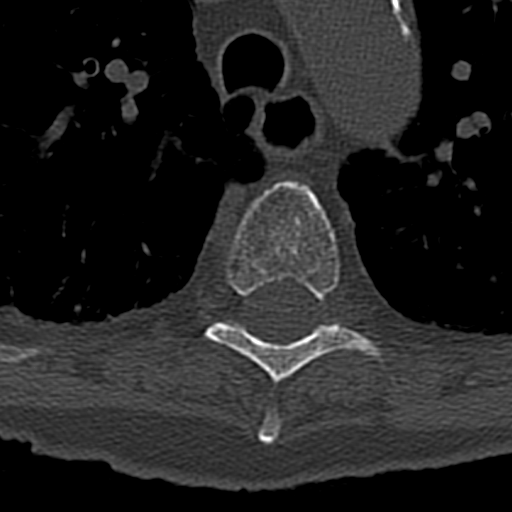
[im 21/103  brain]
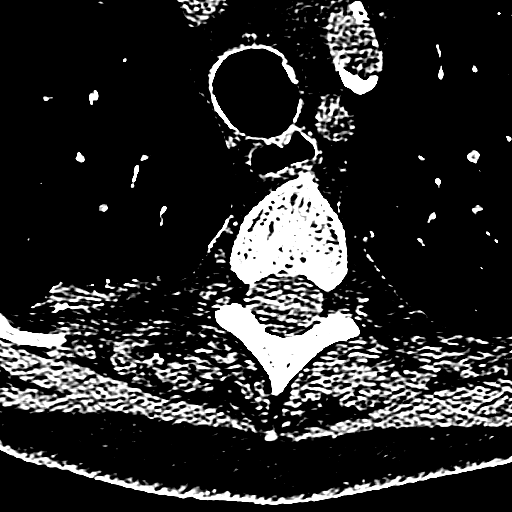
[im 31/103  brain]
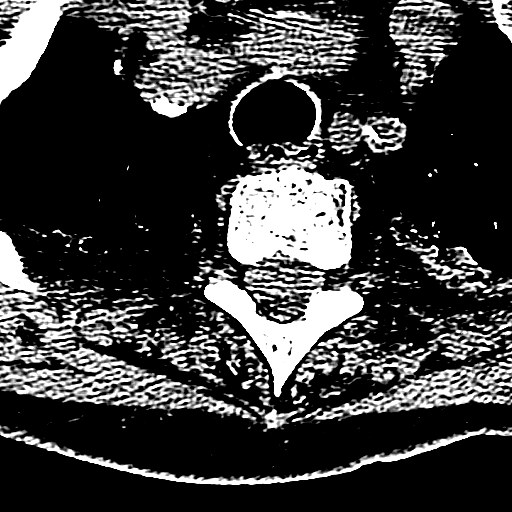
[im 41/103  brain]
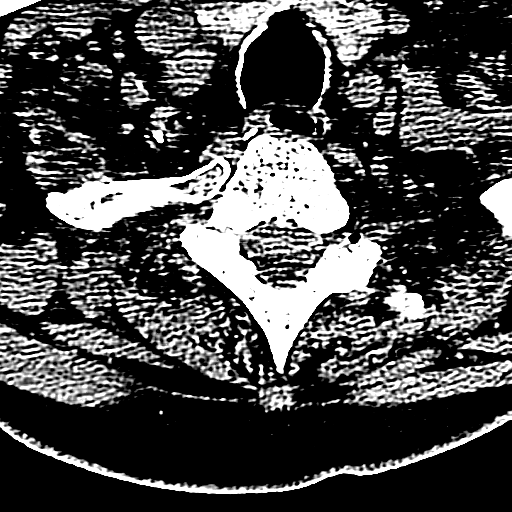
[im 52/103  brain]
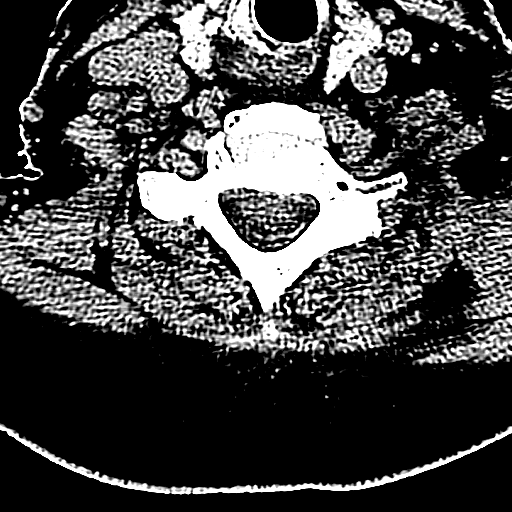
[im 52/103  bone]
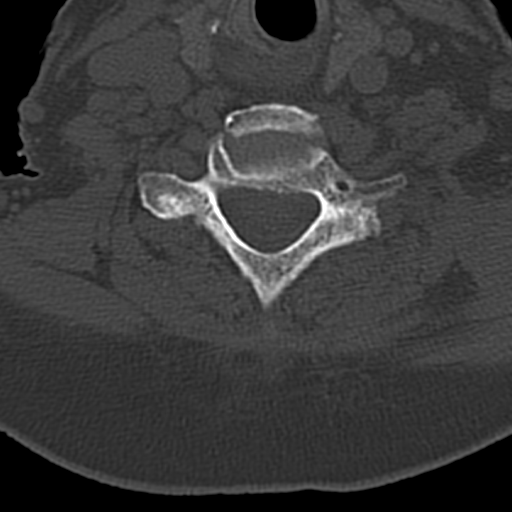
[im 62/103  brain]
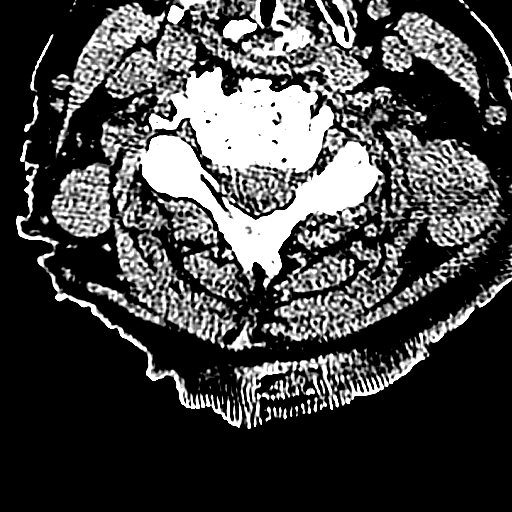
[im 72/103  brain]
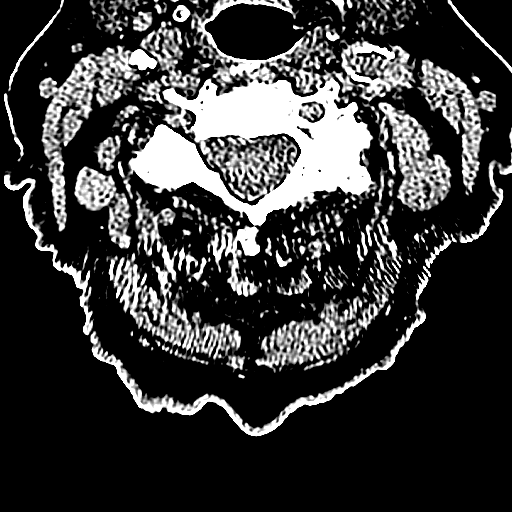
[im 82/103  brain]
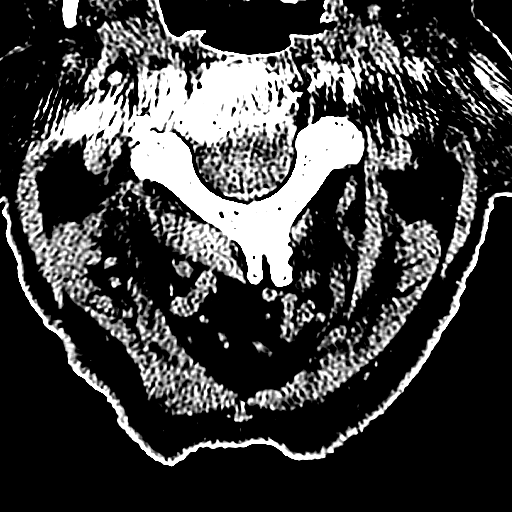
[im 92/103  brain]
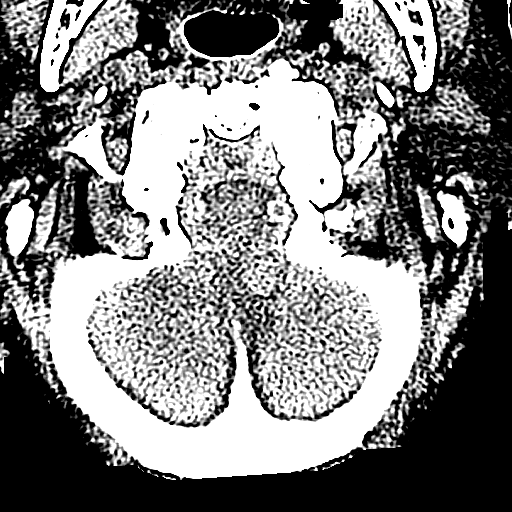
[im 92/103  bone]
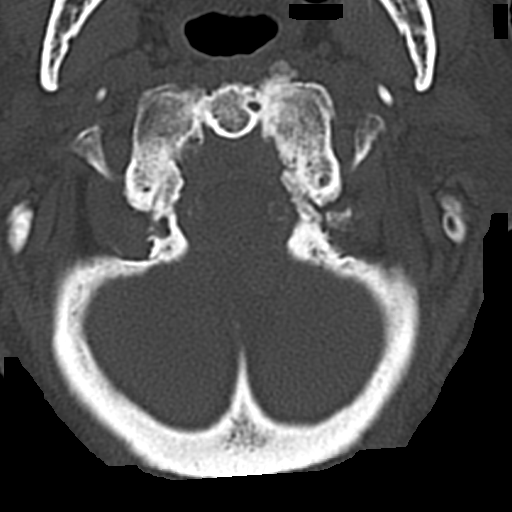

[Series 13: sagittal · sagittal · 0.34mm/px · 2 of 61 slices shown]
[im 21/61  brain]
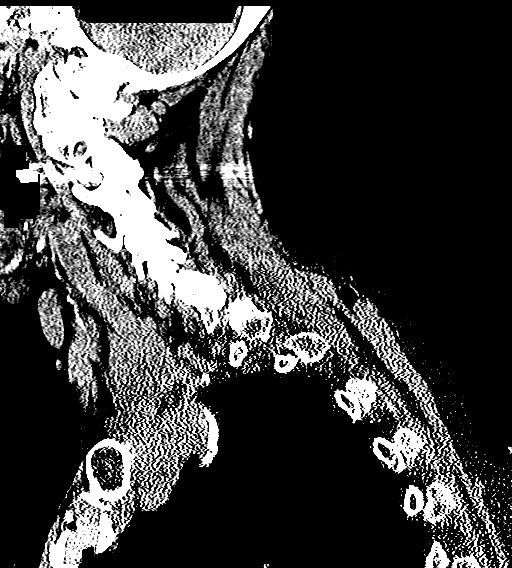
[im 41/61  brain]
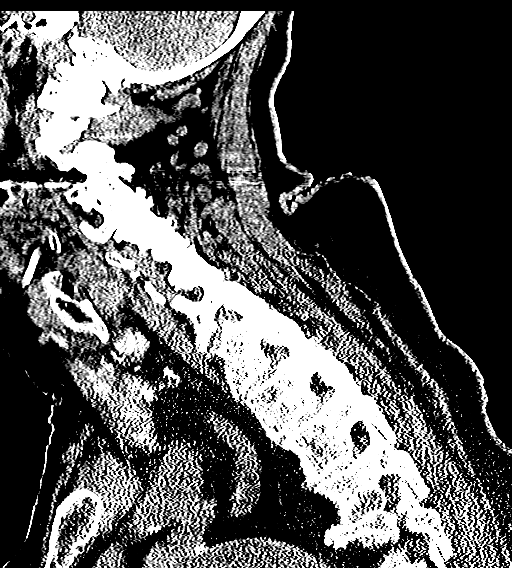

[14 of 47 positions shown; findings below may reference images not displayed]

FINDINGS: CT HEAD FINDINGS

Brain: Stable cerebral volume since 3583. Mild dystrophic
calcifications at the basal ganglia again noted. Similar mild
dystrophic calcifications in the deep left cerebellar nuclei. Stable
gray-white matter differentiation throughout the brain. No
cortically based acute infarct identified. No acute intracranial
hemorrhage identified. No midline shift, mass effect, or evidence of
intracranial mass lesion. No ventriculomegaly.

Vascular: Calcified atherosclerosis at the skull base.

Skull: Stable and intact.

Sinuses/Orbits: Visualized paranasal sinuses and mastoids are stable
and well pneumatized.

Other: Broad-based left posterosuperior convexity scalp hematoma
measures up to 14 mm in thickness. Underlying calvarium intact.

Other visible scalp and orbits soft tissues appear normal.

CT CERVICAL SPINE FINDINGS

Alignment: Straightening of cervical lordosis. Trace degenerative
appearing anterolisthesis of C7 on T1. Similar anterolisthesis of T1
on T2.

Skull base and vertebrae: Visualized skull base is intact. No
atlanto-occipital dissociation. No cervical spine fracture
identified.

Soft tissues and spinal canal: No prevertebral fluid or swelling. No
visible canal hematoma. Calcified atherosclerosis in the neck.

Disc levels: Severe occipital condyles -C1 and C1-C2 joint space
loss. Visualized skull base is intact. No atlanto-occipital
dissociation. Bulky degenerative ligamentous hypertrophy about the
odontoid. Bilateral C2 to C4 posterior element ankylosis. Severe
C4-C5 and C5-C6 disc space loss and endplate spurring. Widespread
bulky facet hypertrophy.

Mild degenerative spinal stenosis at the cervicomedullary junction
in large part due to the ligamentous hypertrophy. Mild degenerative
spinal stenosis suspected at C5-C6 in part due to ligament flavum
hypertrophy.

Upper chest: Visible upper thoracic levels appear intact. Biapical
lung scarring. Calcified aortic atherosclerosis. Negative visualized
superior mediastinum.
IMPRESSION: 1. Scalp hematoma without underlying fracture.
2.  No acute intracranial abnormality.
3.  No acute fracture or listhesis identified in the cervical spine.
4. Severe chronic degenerative changes from the skullbase to the
lower cervical spine. Degenerative spinal stenosis at the
cervicomedullary junction and at C5-C6.
